# Patient Record
Sex: Male | Born: 1955 | Race: White | Hispanic: No | Marital: Married | Smoking: Former smoker
Health system: Southern US, Community
[De-identification: ages and names within clinical notes are randomized; demographics above are authoritative.]

## PROBLEM LIST (undated history)

## (undated) DIAGNOSIS — F411 Generalized anxiety disorder: Secondary | ICD-10-CM

## (undated) DIAGNOSIS — J309 Allergic rhinitis, unspecified: Secondary | ICD-10-CM

## (undated) DIAGNOSIS — I10 Essential (primary) hypertension: Secondary | ICD-10-CM

## (undated) DIAGNOSIS — K5790 Diverticulosis of intestine, part unspecified, without perforation or abscess without bleeding: Secondary | ICD-10-CM

## (undated) DIAGNOSIS — B001 Herpesviral vesicular dermatitis: Secondary | ICD-10-CM

## (undated) HISTORY — DX: Herpesviral vesicular dermatitis: B00.1

## (undated) HISTORY — DX: Generalized anxiety disorder: F41.1

## (undated) HISTORY — DX: Diverticulosis of intestine, part unspecified, without perforation or abscess without bleeding: K57.90

## (undated) HISTORY — DX: Allergic rhinitis, unspecified: J30.9

## (undated) HISTORY — DX: Essential (primary) hypertension: I10

---

## 2001-09-04 ENCOUNTER — Emergency Department (HOSPITAL_COMMUNITY): Admission: EM | Admit: 2001-09-04 | Discharge: 2001-09-04 | Payer: Self-pay | Admitting: *Deleted

## 2001-09-11 ENCOUNTER — Emergency Department (HOSPITAL_COMMUNITY): Admission: EM | Admit: 2001-09-11 | Discharge: 2001-09-11 | Payer: Self-pay | Admitting: Emergency Medicine

## 2005-08-25 ENCOUNTER — Emergency Department (HOSPITAL_COMMUNITY): Admission: EM | Admit: 2005-08-25 | Discharge: 2005-08-25 | Payer: Self-pay | Admitting: Family Medicine

## 2005-09-01 ENCOUNTER — Emergency Department (HOSPITAL_COMMUNITY): Admission: EM | Admit: 2005-09-01 | Discharge: 2005-09-01 | Payer: Self-pay | Admitting: Emergency Medicine

## 2007-10-13 DIAGNOSIS — K5790 Diverticulosis of intestine, part unspecified, without perforation or abscess without bleeding: Secondary | ICD-10-CM

## 2007-10-13 HISTORY — DX: Diverticulosis of intestine, part unspecified, without perforation or abscess without bleeding: K57.90

## 2007-10-13 LAB — HM COLONOSCOPY

## 2009-11-04 ENCOUNTER — Emergency Department (HOSPITAL_COMMUNITY): Admission: EM | Admit: 2009-11-04 | Discharge: 2009-11-04 | Payer: Self-pay | Admitting: Emergency Medicine

## 2010-09-30 DIAGNOSIS — I10 Essential (primary) hypertension: Secondary | ICD-10-CM

## 2010-09-30 HISTORY — DX: Essential (primary) hypertension: I10

## 2011-12-18 ENCOUNTER — Ambulatory Visit (INDEPENDENT_AMBULATORY_CARE_PROVIDER_SITE_OTHER): Payer: Managed Care, Other (non HMO) | Admitting: Family Medicine

## 2011-12-18 ENCOUNTER — Encounter: Payer: Self-pay | Admitting: Family Medicine

## 2011-12-18 VITALS — BP 150/100 | HR 76 | Ht 69.25 in | Wt 210.0 lb

## 2011-12-18 DIAGNOSIS — I10 Essential (primary) hypertension: Secondary | ICD-10-CM

## 2011-12-18 DIAGNOSIS — J309 Allergic rhinitis, unspecified: Secondary | ICD-10-CM | POA: Insufficient documentation

## 2011-12-18 MED ORDER — FLUTICASONE PROPIONATE 50 MCG/ACT NA SUSP: 2.0000 | Freq: Every day | NASAL | Status: DC

## 2011-12-18 NOTE — Patient Instructions (Addendum)
Please check BP's regularly at home, and keep a journal (date, am/pm/comments) Low sodium diet. Regular exercise, at least 30 minutes of aerobic activity most days of the week (5-6 days) Avoid using decongestants.  Change from Claritin D to either plain Claritin or plain Zyrtec.  Use sinus rinses or Neti-pot if needed for sinus pain.  Use the Flonase every day.  If your allergy symptoms are improving, you can consider cutting back to 1 spray daily in each nostril for maintenance, but it is safe to continue to use 2 sprays in each nostril everyday longterm if needed    2 Gram Low Sodium Diet A 2 gram sodium diet restricts the amount of sodium in the diet to no more than 2 g or 2000 mg daily. Limiting the amount of sodium is often used to help lower blood pressure. It is important if you have heart, liver, or kidney problems. Many foods contain sodium for flavor and sometimes as a preservative. When the amount of sodium in a diet needs to be low, it is important to know what to look for when choosing foods and drinks. The following includes some information and guidelines to help make it easier for you to adapt to a low sodium diet. QUICK TIPS  Do not add salt to food.   Avoid convenience items and fast food.   Choose unsalted snack foods.   Buy lower sodium products, often labeled as "lower sodium" or "no salt added."   Check food labels to learn how much sodium is in 1 serving.   When eating at a restaurant, ask that your food be prepared with less salt or none, if possible.  READING FOOD LABELS FOR SODIUM INFORMATION The nutrition facts label is a good place to find how much sodium is in foods. Look for products with no more than 500 to 600 mg of sodium per meal and no more than 150 mg per serving. Remember that 2 g = 2000 mg. The food label may also list foods as:  Sodium-free: Less than 5 mg in a serving.   Very low sodium: 35 mg or less in a serving.   Low-sodium: 140 mg or less in  a serving.   Light in sodium: 50% less sodium in a serving. For example, if a food that usually has 300 mg of sodium is changed to become light in sodium, it will have 150 mg of sodium.   Reduced sodium: 25% less sodium in a serving. For example, if a food that usually has 400 mg of sodium is changed to reduced sodium, it will have 300 mg of sodium.  CHOOSING FOODS Grains  Avoid: Salted crackers and snack items. Some cereals, including instant hot cereals. Bread stuffing and biscuit mixes. Seasoned rice or pasta mixes.   Choose: Unsalted snack items. Low-sodium cereals, oats, puffed wheat and rice, shredded wheat. English muffins and bread. Pasta.  Meats  Avoid: Salted, canned, smoked, spiced, pickled meats, including fish and poultry. Bacon, ham, sausage, cold cuts, hot dogs, anchovies.   Choose: Low-sodium canned tuna and salmon. Fresh or frozen meat, poultry, and fish.  Dairy  Avoid: Processed cheese and spreads. Cottage cheese. Buttermilk and condensed milk. Regular cheese.   Choose: Milk. Low-sodium cottage cheese. Yogurt. Sour cream. Low-sodium cheese.  Fruits and Vegetables  Avoid: Regular canned vegetables. Regular canned tomato sauce and paste. Frozen vegetables in sauces. Olives. Rosita Fire. Relishes. Sauerkraut.   Choose: Low-sodium canned vegetables. Low-sodium tomato sauce and paste. Frozen or fresh vegetables.  Fresh and frozen fruit.  Condiments  Avoid: Canned and packaged gravies. Worcestershire sauce. Tartar sauce. Barbecue sauce. Soy sauce. Steak sauce. Ketchup. Onion, garlic, and table salt. Meat flavorings and tenderizers.   Choose: Fresh and dried herbs and spices. Low-sodium varieties of mustard and ketchup. Lemon juice. Tabasco sauce. Horseradish.  SAMPLE 2 GRAM SODIUM MEAL PLAN Breakfast / Sodium (mg)  1 cup low-fat milk / 143 mg   2 slices whole-wheat toast / 270 mg   1 tbs heart-healthy margarine / 153 mg   1 hard-boiled egg / 139 mg   1 small orange /  0 mg  Lunch / Sodium (mg)  1 cup raw carrots / 76 mg    cup hummus / 298 mg   1 cup low-fat milk / 143 mg    cup red grapes / 2 mg   1 whole-wheat pita bread / 356 mg  Dinner / Sodium (mg)  1 cup whole-wheat pasta / 2 mg   1 cup low-sodium tomato sauce / 73 mg   3 oz lean ground beef / 57 mg   1 small side salad (1 cup raw spinach leaves,  cup cucumber,  cup yellow bell pepper) with 1 tsp olive oil and 1 tsp red wine vinegar / 25 mg  Snack / Sodium (mg)  1 container low-fat vanilla yogurt / 107 mg   3 graham cracker squares / 127 mg  Nutrient Analysis  Calories: 2033   Protein: 77 g   Carbohydrate: 282 g   Fat: 72 g   Sodium: 1971 mg  Document Released: 09/16/2005 Document Revised: 09/05/2011 Document Reviewed: 12/18/2009 [facility] Patient Information 2012 [place], [place].

## 2011-12-18 NOTE — Progress Notes (Signed)
Patient presents to establish care.    He was diagnosed with HTN last year.  Sometimes BP's run 147/92, other times is 135-138/86. Doesn't add much salt to foods, but eats processed foods.  Doesn't exercise daily, but a couple of times a week.  Walks the golf course some. He denies any cough, and reports being compliant with taking his lisinopril.  Allergies--runny nose, ear congestion, sinus headaches.  Currently taking Claritin D 12 hour with improvement in symptoms.  Previously recalls using nasal steroid sprays (nasacort), but didn't use it consistently.  Brings in labs from 05/2011----TG 107, HDL 50, LDL 113, total 184.  B-met normal  Past Medical History  Diagnosis Date  . Hypertension 2012  . Allergic rhinitis, cause unspecified     seasonal  . Herpes labialis     (prev treated by Dr. Yetta Barre with Valtrex 1 gm)  . Anxiety state, unspecified     resolved, related to death of nephew    History reviewed. No pertinent past surgical history.  History   Social History  . Marital Status: Married    Spouse Name: N/A    Number of Children: 2  . 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 Smoking status: Former Smoker    Quit date: 09/30/1998  . Smokeless tobacco: Never Used  . Alcohol Use: Yes     2-4 drinks per week.  . Drug Use: No  . Sexually Active: Not on file   Other Topics Concern  . Not on file   Social History Narrative   Married.  Son is an air [employer] MD in [place].  Expecting first granddaughter.  Daughter lives in [place]. 1 cat, 1 dog    Family History  Problem Relation Age of Onset  . Stroke Mother   . Hypertension Mother   . Parkinsonism Mother   . Parkinsonism Father   . Heart disease Father 36    MI  . COPD Maternal Grandfather   . Diabetes Neg Hx   . Cancer Neg Hx     Current outpatient prescriptions:lisinopril (PRINIVIL,ZESTRIL) 10 MG tablet, Take 10 mg by mouth  daily., Disp: , Rfl: ;  loratadine-pseudoephedrine (CLARITIN-D 12-HOUR) 5-120 MG per tablet, Take 1 tablet by mouth 2 (two) times daily., Disp: , Rfl: ;  ALPRAZolam (XANAX) 1 MG tablet, , Disp: , Rfl: ;  fluticasone (FLONASE) 50 MCG/ACT nasal spray, Place 2 sprays into the nose daily., Disp: 16 g, Rfl: 6 Valtrex prn (1gm)  No Known Allergies  ROS:  Denies fevers. Occasional headaches.  Denies dizziness, chest pain. Occasional palpitations.  Occasional heartburn after certain foods, relieved by Tums.  Denies abdominal pain, blood in stool, urinary problems, skin rashes, or other concerns. Took xanax related to his nephew's death.  Anxiety has been better since his dad passed away. +Cold sores--uses Valtrex prn  PHYSICAL EXAM: BP 150/100  Pulse 76  Ht 5' 9.25" (1.759 m)  Wt 210 lb (95.255 kg)  BMI 30.79 kg/m2 Well developed, pleasant, overweight male in no distress HEENT:  PERRL, EOMI, conjunctiva clear.  TM's and EAC's normal.  Nasal mucosa moderately edematous, pale, no purulence.  Sinuses nontender.  Op clear Neck: no lymphadenopathy or thyromegaly, no carotid bruit Heart: regular rate and rhythm without murmur Lungs: clear bilaterally Abdomen: soft, obese, nontender, no organomegaly or mass Extremities: no edema, 2+ pulse Skin: no rash Psych: normal mood, affect, hygiene and grooming  ASSESSMENT/PLAN:  1. Allergic rhinitis, cause unspecified  fluticasone (FLONASE) 50 MCG/ACT nasal spray  2. Essential hypertension, benign     bp suboptimally controlled, but decongestants may be contributing.  Reviewed low sodium diet, daily exercise, avoid decongestants. Monitor BP regularly.  Continue current dose of lisinopril.   F/u in 1 month with list of BP's.  Bring monitor to f/u visit to have accuracy checked.  If BP's still >135/85, plan to increase lisinopril dose.  Allergies--stop Claritin D.  May use plain Claritin or Zyrtec.  Start Flonase--instructed on proper use.

## 2011-12-30 ENCOUNTER — Encounter: Payer: Self-pay | Admitting: Family Medicine

## 2012-01-01 ENCOUNTER — Encounter: Payer: Self-pay | Admitting: *Deleted

## 2012-01-27 ENCOUNTER — Encounter: Payer: Self-pay | Admitting: Family Medicine

## 2012-01-27 ENCOUNTER — Ambulatory Visit (INDEPENDENT_AMBULATORY_CARE_PROVIDER_SITE_OTHER): Payer: Managed Care, Other (non HMO) | Admitting: Family Medicine

## 2012-01-27 VITALS — BP 132/84 | HR 76 | Ht 69.25 in | Wt 209.0 lb

## 2012-01-27 DIAGNOSIS — I10 Essential (primary) hypertension: Secondary | ICD-10-CM

## 2012-01-27 DIAGNOSIS — J309 Allergic rhinitis, unspecified: Secondary | ICD-10-CM

## 2012-01-27 MED ORDER — LISINOPRIL 10 MG PO TABS: 10.0000 mg | ORAL_TABLET | Freq: Every day | ORAL | Status: DC

## 2012-01-27 NOTE — Progress Notes (Signed)
Patient presents for follow-up on his blood pressure.  At his last visit, his allergies were flaring, and had been using decongestants, and BP was elevated.  Allergies are doing much better since on the Flonase, and using claritin (plain) just as needed.  BP's at home 121/78 - 142/85, mostly 130's/80.  He ran out of BP medication yesterday--hasn't taken it today yet.  Denies headaches, cough.  Getting exercise daily, and has lot 6 pounds per scale at home. Old records reviewed--last lipids 05/2011 total 184, LDL 113, HDL 50  Past Medical History  Diagnosis Date  . Hypertension 2012  . Allergic rhinitis, cause unspecified     seasonal  . Herpes labialis     (prev treated by Dr. Yetta Barre with Valtrex 1 gm)  . Anxiety state, unspecified     resolved, related to death of nephew  . Diverticulosis 10/13/07    on colonoscopy (Dr. Elnoria Howard)  . Hemorrhoids 10/13/07   Current Outpatient Prescriptions on File Prior to Visit  Medication Sig Dispense Refill  . fluticasone (FLONASE) 50 MCG/ACT nasal spray Place 2 sprays into the nose daily.  16 g  6  . DISCONTD: lisinopril (PRINIVIL,ZESTRIL) 10 MG tablet Take 10 mg by mouth daily.      Marland Kitchen ALPRAZolam (XANAX) 1 MG tablet        No Known Allergies  ROS:  Denies fevers, URI symptoms, chest pain, headaches, dizziness, edema, shortness of breath or other concerns  PHYSICAL EXAM: BP 132/84  Pulse 76  Ht 5' 9.25" (1.759 m)  Wt 209 lb (94.802 kg)  BMI 30.64 kg/m2  Pt's machine L arm 153/93 R arm by MD 154/88  Neck: no lymphadenopathy Heart: regular rate and rhythm without murmur Lungs: clear bilaterally Extremities: no edema Psych: normal mood, affect  ASSESSMENT/PLAN: 1. Essential hypertension, benign  lisinopril (PRINIVIL,ZESTRIL) 10 MG tablet  2. Allergic rhinitis, cause unspecified     Allergies improved with flonase. HTN--borderline control, some white coat component, but his machine is accurate.  Continue lifestyle modifications and current  dose of lisinopril.  If remains borderline at his CPE, consider increasing dose.

## 2012-01-27 NOTE — Patient Instructions (Signed)
Continue low sodium diet, daily exercise and weight loss. Continue to monitor your blood pressure 1-2x/week  Bring list to your next appointment. Send list or call sooner, if BP's are running consistently >140/90

## 2012-02-21 ENCOUNTER — Encounter: Payer: Self-pay | Admitting: Family Medicine

## 2012-02-21 ENCOUNTER — Ambulatory Visit (INDEPENDENT_AMBULATORY_CARE_PROVIDER_SITE_OTHER): Payer: Managed Care, Other (non HMO) | Admitting: Family Medicine

## 2012-02-21 VITALS — BP 120/78 | HR 76 | Temp 97.9°F | Ht 70.5 in | Wt 209.0 lb

## 2012-02-21 DIAGNOSIS — L089 Local infection of the skin and subcutaneous tissue, unspecified: Secondary | ICD-10-CM

## 2012-02-21 DIAGNOSIS — L723 Sebaceous cyst: Secondary | ICD-10-CM

## 2012-02-21 MED ORDER — CEPHALEXIN 500 MG PO CAPS: 500.0000 mg | ORAL_CAPSULE | Freq: Three times a day (TID) | ORAL | Status: AC

## 2012-02-21 NOTE — Patient Instructions (Signed)
Sebaceous cyst with early infection.  Not ready to be drained. Warm compresses and course of Keflex (antibiotic).  Return next week if increasing size, pain, fever  Epidermal Cyst An epidermal cyst is sometimes called a sebaceous cyst, epidermal inclusion cyst, or infundibular cyst. These cysts usually contain a substance that looks "pasty" or "cheesy" and may have a bad smell. This substance is a protein called keratin. Epidermal cysts are usually found on the face, neck, or trunk. They may also occur in the vaginal area or other parts of the genitalia of both men and women. Epidermal cysts are usually small, painless, slow-growing bumps or lumps that move freely under the skin. It is important not to try to pop them. This may cause an infection and lead to tenderness and swelling. CAUSES  Epidermal cysts may be caused by a deep penetrating injury to the skin or a plugged hair follicle, often associated with acne. SYMPTOMS  Epidermal cysts can become inflamed and cause:  Redness.   Tenderness.   Increased temperature of the skin over the bumps or lumps.   Grayish-white, bad smelling material that drains from the bump or lump.  DIAGNOSIS  Epidermal cysts are easily diagnosed by your caregiver during an exam. Rarely, a tissue sample (biopsy) may be taken to rule out other conditions that may resemble epidermal cysts. TREATMENT   Epidermal cysts often get better and disappear on their own. They are rarely ever cancerous.   If a cyst becomes infected, it may become inflamed and tender. This may require opening and draining the cyst. Treatment with antibiotics may be necessary. When the infection is gone, the cyst may be removed with minor surgery.   Small, inflamed cysts can often be treated with antibiotics or by injecting steroid medicines.   Sometimes, epidermal cysts become large and bothersome. If this happens, surgical removal in your caregiver's office may be necessary.  HOME CARE  INSTRUCTIONS  Only take over-the-counter or prescription medicines as directed by your caregiver.   Take your antibiotics as directed. Finish them even if you start to feel better.  SEEK MEDICAL CARE IF:   Your cyst becomes tender, red, or swollen.   Your condition is not improving or is getting worse.   You have any other questions or concerns.  MAKE SURE YOU:  Understand these instructions.   Will watch your condition.   Will get help right away if you are not doing well or get worse.  Document Released: 08/17/2004 Document Revised: 09/05/2011 Document Reviewed: 03/25/2011 [facility] Patient Information 2012 [place], [place].

## 2012-02-21 NOTE — Progress Notes (Signed)
Chief Complaint  Patient presents with  . bump on neck    bump on the back of neck. noticed it last 4-5 days. alittle pain and hard.    HPI:  Has had similar problems in the last 10 years.  Has been treated with creams and antibiotics in the past. Denies fevers or drainage, but lump is increasing in size, and is a little sore.  Past Medical History  Diagnosis Date  . Hypertension 2012  . Allergic rhinitis, cause unspecified     seasonal  . Herpes labialis     (prev treated by Dr. Yetta Barre with Valtrex 1 gm)  . Anxiety state, unspecified     resolved, related to death of nephew  . Diverticulosis 10/13/07    on colonoscopy (Dr. Elnoria Howard)  . Hemorrhoids 10/13/07   No past surgical history on file. Current Outpatient Prescriptions on File Prior to Visit  Medication Sig Dispense Refill  . fluticasone (FLONASE) 50 MCG/ACT nasal spray Place 2 sprays into the nose daily.  16 g  6  . lisinopril (PRINIVIL,ZESTRIL) 10 MG tablet Take 1 tablet (10 mg total) by mouth daily.  90 tablet  1  . ALPRAZolam (XANAX) 1 MG tablet        No Known Allergies  ROS: BP's have been good, diastolic usually <57.  Denies headaches, fevers, nausea, vomiting, other skin complaints or rashes.  PHYSICAL EXAM: BP 120/78  Pulse 76  Ht 5' 10.5" (1.791 m)  Wt 209 lb (94.802 kg)  BMI 29.56 kg/m2  L posterior neck--2.5 x 1.5 cm subcutaneous mass with black pore noted at 10 o'clock position.  No fluctuance or drainage.  Mild erythema, no warmth. Neck: no lymphadenopathy  ASSESSMENT/PLAN: 1. Infected sebaceous cyst of skin  cephALEXin (KEFLEX) 500 MG capsule   Sebaceous cyst with early infection.  Not ready to be I&D's.  Warm compresses and course of Keflex.  Return next week if increasing size, pain, fever

## 2012-06-03 ENCOUNTER — Encounter: Payer: Self-pay | Admitting: Family Medicine

## 2012-06-03 ENCOUNTER — Ambulatory Visit (INDEPENDENT_AMBULATORY_CARE_PROVIDER_SITE_OTHER): Payer: Managed Care, Other (non HMO) | Admitting: Family Medicine

## 2012-06-03 VITALS — BP 142/90 | HR 72 | Ht 70.0 in | Wt 210.0 lb

## 2012-06-03 DIAGNOSIS — J309 Allergic rhinitis, unspecified: Secondary | ICD-10-CM

## 2012-06-03 DIAGNOSIS — Z0000 Encounter for general adult medical examination without abnormal findings: Secondary | ICD-10-CM

## 2012-06-03 DIAGNOSIS — I10 Essential (primary) hypertension: Secondary | ICD-10-CM

## 2012-06-03 DIAGNOSIS — Z125 Encounter for screening for malignant neoplasm of prostate: Secondary | ICD-10-CM

## 2012-06-03 LAB — POCT URINALYSIS DIPSTICK
Bilirubin, UA: NEGATIVE
Blood, UA: NEGATIVE
Glucose, UA: NEGATIVE
Ketones, UA: NEGATIVE
Leukocytes, UA: NEGATIVE
Nitrite, UA: NEGATIVE
Protein, UA: NEGATIVE
Spec Grav, UA: <=1.005
Urobilinogen, UA: NEGATIVE
pH, UA: 7

## 2012-06-03 LAB — CBC WITH DIFFERENTIAL/PLATELET
Basophils Absolute: 0 10*3/uL (ref 0.0–0.1)
Basophils Relative: 1 % (ref 0–1)
Eosinophils Absolute: 0.2 10*3/uL (ref 0.0–0.7)
Eosinophils Relative: 2 % (ref 0–5)
HCT: 46.5 % (ref 39.0–52.0)
Hemoglobin: 16.2 g/dL (ref 13.0–17.0)
Lymphocytes Relative: 33 % (ref 12–46)
Lymphs Abs: 2.1 10*3/uL (ref 0.7–4.0)
MCH: 30.1 pg (ref 26.0–34.0)
MCHC: 34.8 g/dL (ref 30.0–36.0)
MCV: 86.4 fL (ref 78.0–100.0)
Monocytes Absolute: 0.4 10*3/uL (ref 0.1–1.0)
Monocytes Relative: 7 % (ref 3–12)
Neutro Abs: 3.8 10*3/uL (ref 1.7–7.7)
Neutrophils Relative %: 57 % (ref 43–77)
Platelets: 257 10*3/uL (ref 150–400)
RBC: 5.38 MIL/uL (ref 4.22–5.81)
RDW: 13.1 % (ref 11.5–15.5)
WBC: 6.5 10*3/uL (ref 4.0–10.5)

## 2012-06-03 LAB — COMPREHENSIVE METABOLIC PANEL
ALT: 20 U/L (ref 0–53)
CO2: 25 mEq/L (ref 19–32)
Calcium: 9.9 mg/dL (ref 8.4–10.5)
Chloride: 104 mEq/L (ref 96–112)
Creat: 0.93 mg/dL (ref 0.50–1.35)
Glucose, Bld: 94 mg/dL (ref 70–99)

## 2012-06-03 LAB — LIPID PANEL
Cholesterol: 166 mg/dL (ref 0–200)
Total CHOL/HDL Ratio: 3.5 Ratio
Triglycerides: 129 mg/dL (ref <150)

## 2012-06-03 MED ORDER — LISINOPRIL 10 MG PO TABS
10.0000 mg | ORAL_TABLET | Freq: Every day | ORAL | Status: DC
Start: 2012-06-03 — End: 2012-11-30

## 2012-06-03 NOTE — Progress Notes (Signed)
Tyler Martin is a 56 y.o. male who presents for a complete physical.  He has the following concerns:  HTN follow-up.  BP's at home are running 116-145/78-91, mostly running  Mid 130's/mid 80's.    Immunization History  Administered Date(s) Administered  . Tdap 08/30/2010  gets flu shots yearly Last colonoscopy: 10/2007 Last PSA: 5 years ago Dentist: twice yearly Ophtho:  yearly Exercise:  3-6 days/week--weights, walking  Past Medical History  Diagnosis Date  . Hypertension 2012  . Allergic rhinitis, cause unspecified     seasonal  . Herpes labialis     (prev treated by Dr. Yetta Barre with Valtrex 1 gm)  . Anxiety state, unspecified     resolved, related to death of nephew  . Diverticulosis 10/13/07    on colonoscopy (Dr. Elnoria Howard)  . Hemorrhoids 10/13/07    History reviewed. No pertinent past surgical history.  History   Social History  . Marital Status: Married    Spouse Name: N/A    Number of Children: 2  . 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 Smoking status: Former Smoker -- 1.0 packs/day for 30 years    Types: Cigarettes    Quit date: 09/30/1998  . Smokeless tobacco: Never Used  . Alcohol Use: Yes     2-4 drinks per week.  . Drug Use: No  . Sexually Active: Yes -- Male partner(s)   Other Topics Concern  . Not on file   Social History Narrative   Married.  Son is an [employer] MD in [place] [employer]).  Daughter lives in [place]. 1 cat. 1 granddaughter.    Family History  Problem Relation Age of Onset  . Stroke Mother   . Hypertension Mother   . Parkinsonism Mother   . Parkinsonism Father   . Heart disease Father 24    MI  . COPD Maternal Grandfather   . Diabetes Neg Hx   . Cancer Maternal Uncle     prostate    Current outpatient prescriptions:fluticasone (FLONASE) 50 MCG/ACT nasal spray, Place 2 sprays into the nose daily., Disp: 16 g, Rfl: 6;   lisinopril (PRINIVIL,ZESTRIL) 10 MG tablet, Take 1 tablet (10 mg total) by mouth daily., Disp: 90 tablet, Rfl: 1;  ALPRAZolam (XANAX) 1 MG tablet, Take 1 mg by mouth as needed. , Disp: , Rfl:   No Known Allergies   ROS:  The patient denies anorexia, fever, weight changes, headaches,  vision loss, decreased hearing, ear pain, hoarseness, chest pain, palpitations, dizziness, syncope, dyspnea on exertion, cough, swelling, nausea, vomiting, diarrhea, constipation, abdominal pain, melena, hematochezia, indigestion/heartburn, hematuria, incontinence, erectile dysfunction, nocturia, weakened urine stream, dysuria, genital lesions, joint pains, numbness, tingling, weakness, tremor, suspicious skin lesions, depression, anxiety, abnormal bleeding/bruising, or enlarged lymph nodes  PHYSICAL EXAM: BP 150/100  Pulse 72  Ht 5\' 10"  (1.778 m)  Wt 210 lb (95.255 kg)  BMI 30.13 kg/m2 142 90  General Appearance:    Alert, cooperative, no distress, appears stated age  Head:    Normocephalic, without obvious abnormality, atraumatic  Eyes:    PERRL, conjunctiva/corneas clear, EOM's intact, fundi    benign  Ears:    Normal TM's and external ear canals  Nose:   Nares normal, mucosa normal, no drainage or sinus   tenderness  Throat:   Lips, mucosa, and tongue normal; teeth and gums normal  Neck:   Supple, no lymphadenopathy;  thyroid:  no   enlargement/tenderness/nodules; no carotid   bruit or JVD  Back:    Spine nontender, no curvature, ROM normal, no CVA     tenderness  Lungs:     Clear to auscultation bilaterally without wheezes, rales or     ronchi; respirations unlabored  Chest Wall:    No tenderness or deformity   Heart:    Regular rate and rhythm, S1 and S2 normal, no murmur, rub   or gallop  Breast Exam:    No chest wall tenderness, masses or gynecomastia  Abdomen:     Soft, non-tender, obese, nondistended, normoactive bowel sounds,    no masses, no hepatosplenomegaly  Genitalia:    Normal male  external genitalia without lesions.  Testicles without masses.  No inguinal hernias.  Rectal:    Normal sphincter tone, no masses or tenderness; guaiac negative stool.  Prostate smooth, no nodules, not enlarged.  Extremities:   No clubbing, cyanosis or edema  Pulses:   2+ and symmetric all extremities  Skin:   Skin color, texture, turgor normal, no rashes or lesions  Lymph nodes:   Cervical, supraclavicular, and axillary nodes normal  Neurologic:   CNII-XII intact, normal strength, sensation and gait; reflexes 2+ and symmetric throughout          Psych:   Normal mood, affect, hygiene and grooming.     ASSESSMENT/PLAN: 1. Routine general medical examination at a health care facility  POCT Urinalysis Dipstick, Visual acuity screening, CBC with Differential  2. Allergic rhinitis, cause unspecified    3. Essential hypertension, benign  CBC with Differential, Comprehensive metabolic panel, Lipid panel, lisinopril (PRINIVIL,ZESTRIL) 10 MG tablet  4. Special screening for malignant neoplasm of prostate  PSA    HTN--borderline control. Discussed increasing lisinopril vs weight loss and low sodium diet.  Prefers latter.   Reviewed portion control, healthy diet, increased exercise to help with weight loss.  Discussed PSA screening (risks/benefits), recommended at least 30 minutes of aerobic activity at least 5 days/week; proper sunscreen use reviewed; healthy diet and alcohol recommendations (less than or equal to 2 drinks/day) reviewed; regular seatbelt use; changing batteries in smoke detectors. Self-testicular exams. Immunization recommendations discussed--annual flu shots, shingles vaccine (if covered).  Colonoscopy recommendations reviewed--UTD  Shingles vaccine discussed--will check insurance, and if not covered until 60, will wait.   F/u 6 months on BP

## 2012-06-03 NOTE — Patient Instructions (Addendum)
HEALTH MAINTENANCE RECOMMENDATIONS:  It is recommended that you get at least 30 minutes of aerobic exercise at least 5 days/week (for weight loss, you may need as much as 60-90 minutes). This can be any activity that gets your heart rate up. This can be divided in 10-15 minute intervals if needed, but try and build up your endurance at least once a week.  Weight bearing exercise is also recommended twice weekly.  Eat a healthy diet with lots of vegetables, fruits and fiber.  "Colorful" foods have a lot of vitamins (ie green vegetables, tomatoes, red peppers, etc).  Limit sweet tea, regular sodas and alcoholic beverages, all of which has a lot of calories and sugar.  Up to 2 alcoholic drinks daily may be beneficial for men (unless trying to lose weight, watch sugars).  Drink a lot of water.  Sunscreen of at least SPF 30 should be used on all sun-exposed parts of the skin when outside between the hours of 10 am and 4 pm (not just when at beach or pool, but even with exercise, golf, tennis, and yard work!)  Use a sunscreen that says "broad spectrum" so it covers both UVA and UVB rays, and make sure to reapply every 1-2 hours.  Remember to change the batteries in your smoke detectors when changing your clock times in the spring and fall.  Use your seat belt every time you are in a car, and please drive safely and not be distracted with cell phones and texting while driving.  2 Gram Low Sodium Diet A 2 gram sodium diet restricts the amount of sodium in the diet to no more than 2 g or 2000 mg daily. Limiting the amount of sodium is often used to help lower blood pressure. It is important if you have heart, liver, or kidney problems. Many foods contain sodium for flavor and sometimes as a preservative. When the amount of sodium in a diet needs to be low, it is important to know what to look for when choosing foods and drinks. The following includes some information and guidelines to help make it easier for you  to adapt to a low sodium diet. QUICK TIPS  Do not add salt to food.   Avoid convenience items and fast food.   Choose unsalted snack foods.   Buy lower sodium products, often labeled as "lower sodium" or "no salt added."   Check food labels to learn how much sodium is in 1 serving.   When eating at a restaurant, ask that your food be prepared with less salt or none, if possible.  READING FOOD LABELS FOR SODIUM INFORMATION The nutrition facts label is a good place to find how much sodium is in foods. Look for products with no more than 500 to 600 mg of sodium per meal and no more than 150 mg per serving. Remember that 2 g = 2000 mg. The food label may also list foods as:  Sodium-free: Less than 5 mg in a serving.   Very low sodium: 35 mg or less in a serving.   Low-sodium: 140 mg or less in a serving.   Light in sodium: 50% less sodium in a serving. For example, if a food that usually has 300 mg of sodium is changed to become light in sodium, it will have 150 mg of sodium.   Reduced sodium: 25% less sodium in a serving. For example, if a food that usually has 400 mg of sodium is changed to reduced sodium,  it will have 300 mg of sodium.  CHOOSING FOODS Grains  Avoid: Salted crackers and snack items. Some cereals, including instant hot cereals. Bread stuffing and biscuit mixes. Seasoned rice or pasta mixes.   Choose: Unsalted snack items. Low-sodium cereals, oats, puffed wheat and rice, shredded wheat. English muffins and bread. Pasta.  Meats  Avoid: Salted, canned, smoked, spiced, pickled meats, including fish and poultry. Bacon, ham, sausage, cold cuts, hot dogs, anchovies.   Choose: Low-sodium canned tuna and salmon. Fresh or frozen meat, poultry, and fish.  Dairy  Avoid: Processed cheese and spreads. Cottage cheese. Buttermilk and condensed milk. Regular cheese.   Choose: Milk. Low-sodium cottage cheese. Yogurt. Sour cream. Low-sodium cheese.  Fruits and  Vegetables  Avoid: Regular canned vegetables. Regular canned tomato sauce and paste. Frozen vegetables in sauces. Olives. Rosita Fire. Relishes. Sauerkraut.   Choose: Low-sodium canned vegetables. Low-sodium tomato sauce and paste. Frozen or fresh vegetables. Fresh and frozen fruit.  Condiments  Avoid: Canned and packaged gravies. Worcestershire sauce. Tartar sauce. Barbecue sauce. Soy sauce. Steak sauce. Ketchup. Onion, garlic, and table salt. Meat flavorings and tenderizers.   Choose: Fresh and dried herbs and spices. Low-sodium varieties of mustard and ketchup. Lemon juice. Tabasco sauce. Horseradish.  SAMPLE 2 GRAM SODIUM MEAL PLAN Breakfast / Sodium (mg)  1 cup low-fat milk / 143 mg   2 slices whole-wheat toast / 270 mg   1 tbs heart-healthy margarine / 153 mg   1 hard-boiled egg / 139 mg   1 small orange / 0 mg  Lunch / Sodium (mg)  1 cup raw carrots / 76 mg    cup hummus / 298 mg   1 cup low-fat milk / 143 mg    cup red grapes / 2 mg   1 whole-wheat pita bread / 356 mg  Dinner / Sodium (mg)  1 cup whole-wheat pasta / 2 mg   1 cup low-sodium tomato sauce / 73 mg   3 oz lean ground beef / 57 mg   1 small side salad (1 cup raw spinach leaves,  cup cucumber,  cup yellow bell pepper) with 1 tsp olive oil and 1 tsp red wine vinegar / 25 mg  Snack / Sodium (mg)  1 container low-fat vanilla yogurt / 107 mg   3 graham cracker squares / 127 mg  Nutrient Analysis  Calories: 2033   Protein: 77 g   Carbohydrate: 282 g   Fat: 72 g   Sodium: 1971 mg  Document Released: 09/16/2005 Document Revised: 09/05/2011 Document Reviewed: 12/18/2009 [facility] Patient Information 2012 [place], [place].

## 2012-06-04 ENCOUNTER — Encounter: Payer: Self-pay | Admitting: Family Medicine

## 2012-06-11 ENCOUNTER — Other Ambulatory Visit (INDEPENDENT_AMBULATORY_CARE_PROVIDER_SITE_OTHER): Payer: Managed Care, Other (non HMO)

## 2012-06-11 DIAGNOSIS — Z23 Encounter for immunization: Secondary | ICD-10-CM

## 2012-07-15 ENCOUNTER — Other Ambulatory Visit: Payer: Self-pay | Admitting: Family Medicine

## 2012-07-15 MED ORDER — VALACYCLOVIR HCL 1 G PO TABS: ORAL_TABLET | ORAL | Status: DC

## 2012-07-15 NOTE — Telephone Encounter (Signed)
Please confirm that he is needing meds for prn use (when he has outbreak, rather than preventative). Meds should be used as soon as possibly (may not have much benefit if lesion present >48 hrs).

## 2012-07-23 ENCOUNTER — Other Ambulatory Visit: Payer: Self-pay | Admitting: Family Medicine

## 2012-10-26 ENCOUNTER — Ambulatory Visit (INDEPENDENT_AMBULATORY_CARE_PROVIDER_SITE_OTHER): Payer: Managed Care, Other (non HMO) | Admitting: Family Medicine

## 2012-10-26 ENCOUNTER — Encounter: Payer: Self-pay | Admitting: Family Medicine

## 2012-10-26 VITALS — BP 132/84 | HR 84 | Temp 98.6°F | Ht 70.0 in | Wt 208.0 lb

## 2012-10-26 DIAGNOSIS — I10 Essential (primary) hypertension: Secondary | ICD-10-CM

## 2012-10-26 DIAGNOSIS — J069 Acute upper respiratory infection, unspecified: Secondary | ICD-10-CM

## 2012-10-26 MED ORDER — AMOXICILLIN 500 MG PO CAPS: 1000.0000 mg | ORAL_CAPSULE | Freq: Two times a day (BID) | ORAL | Status: DC

## 2012-10-26 NOTE — Patient Instructions (Signed)
Add mucinex.  Continue other current medications.  Monitor BP periodically while taking decongestants. Start antibiotics if symptoms persist/worsen (discolored mucus last more than 7-10 days, fevers start, worsening sinus pain, etc)

## 2012-10-26 NOTE — Progress Notes (Signed)
Chief Complaint  Patient presents with  . URI    congestion that started last Thursday while in [place]. Sunday morning woke up with dry throat. Today sinuses hurt and left ear pain.   HPI:  Started with runny nose about 4 days ago.  Sometimes mucus is clear, other times bloody or green.  Yesterday noticed increase drainage and sore throat, and today is complaining of left ear pain, along with sinus pressure across forehead.    Using Vick's, Nyquil, Dayquil, and started out with Claritin D while he was away in [place].  Meds help dry of some of the drainage.  Just starting to get a hacky cough.  Past Medical History  Diagnosis Date  . Hypertension 2012  . Allergic rhinitis, cause unspecified     seasonal  . Herpes labialis     (prev treated by Dr. Yetta Barre with Valtrex 1 gm)  . Anxiety state, unspecified     resolved, related to death of nephew  . Diverticulosis 10/13/07    on colonoscopy (Dr. Elnoria Howard)  . Hemorrhoids 10/13/07   History reviewed. No pertinent past surgical history. History   Social History  . Marital Status: Married    Spouse Name: N/A    Number of Children: 2  . 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 Smoking status: Former Smoker -- 1.0 packs/day for 30 years    Types: Cigarettes    Quit date: 09/30/1998  . Smokeless tobacco: Never Used  . Alcohol Use: Yes     Comment: 2-4 drinks per week.  . Drug Use: No  . Sexually Active: Yes -- Male partner(s)   Other Topics Concern  . Not on file   Social History Narrative   Married.  Son is an air [employer] MD in [place] [employer]).  Daughter lives in [place]. 1 cat. 1 granddaughter.   Current Outpatient Prescriptions on File Prior to Visit  Medication Sig Dispense Refill  . fluticasone (FLONASE) 50 MCG/ACT nasal spray PLACE 2 SPRAYS INTO THE NOSE DAILY.  16 g  6  . lisinopril (PRINIVIL,ZESTRIL) 10 MG tablet Take 1 tablet (10 mg  total) by mouth daily.  30 tablet  5  . ALPRAZolam (XANAX) 1 MG tablet Take 1 mg by mouth as needed.       . valACYclovir (VALTREX) 1000 MG tablet Take 2 tablets at onset of cold sore.  Repeat once in 12 hours (total of 4 tablets per course)  30 tablet  0   No Known Allergies  ROS:  Denies fevers, nausea, vomiting, diarrhea, skin rashes, bleeding/bruising, myalgias or joint pains.  +sinus headache.  PHYSICAL EXAM: BP 132/84  Pulse 84  Temp 98.6 F (37 C) (Oral)  Ht 5\' 10"   (1.778 m)  Wt 208 lb (94.348 kg)  BMI 29.84 kg/m2 Pleasant male, well appearing, in no distress HEENT: PERRL, EOMI, conjunctiva clear.  Nasal mucosa moderately edematous, clear mucus, some yellow crusting noted.  Sinuses nontender.  OP clear Neck: no lymphadenopathy or mass Heart: regular rate and rhythm without murmur Lungs: clear bilaterally Extremities: no edema Skin: no rash Psych: normal mood, affect, hygiene and grooming  ASSESSMENT/PLAN:  1. URI (upper respiratory infection)  amoxicillin (AMOXIL) 500 MG capsule  2. Essential hypertension, benign     URI Add mucinex.  Continue other current medications.  Monitor BP periodically while taking decongestants. Start antibiotics if symptoms persist/worsen (discolored mucus last more than  7-10 days, fevers start, worsening sinus pain, etc)  Ensure not getting duplicate medications in various OTC combinations  F/u prn

## 2012-11-30 ENCOUNTER — Encounter: Payer: Self-pay | Admitting: Family Medicine

## 2012-11-30 ENCOUNTER — Ambulatory Visit (INDEPENDENT_AMBULATORY_CARE_PROVIDER_SITE_OTHER): Payer: Managed Care, Other (non HMO) | Admitting: Family Medicine

## 2012-11-30 VITALS — BP 152/98 | HR 64 | Ht 70.0 in | Wt 207.0 lb

## 2012-11-30 DIAGNOSIS — I10 Essential (primary) hypertension: Secondary | ICD-10-CM

## 2012-11-30 DIAGNOSIS — Z79899 Other long term (current) drug therapy: Secondary | ICD-10-CM

## 2012-11-30 MED ORDER — LISINOPRIL 20 MG PO TABS: 20.0000 mg | ORAL_TABLET | Freq: Every day | ORAL | Status: DC

## 2012-11-30 NOTE — Patient Instructions (Signed)
Continue to check your blood pressure at home.  Follow low sodium diet.   Return in 3-4 weeks for a lab visit.  This can be nonfasting.  Bring your list of blood pressures with you, to leave for my review. If blood pressures are running <140/90 (ideally <135/85), then you have refills for 6 months.  2 Gram Low Sodium Diet A 2 gram sodium diet restricts the amount of sodium in the diet to no more than 2 g or 2000 mg daily. Limiting the amount of sodium is often used to help lower blood pressure. It is important if you have heart, liver, or kidney problems. Many foods contain sodium for flavor and sometimes as a preservative. When the amount of sodium in a diet needs to be low, it is important to know what to look for when choosing foods and drinks. The following includes some information and guidelines to help make it easier for you to adapt to a low sodium diet. QUICK TIPS  Do not add salt to food.  Avoid convenience items and fast food.  Choose unsalted snack foods.  Buy lower sodium products, often labeled as "lower sodium" or "no salt added."  Check food labels to learn how much sodium is in 1 serving.  When eating at a restaurant, ask that your food be prepared with less salt or none, if possible. READING FOOD LABELS FOR SODIUM INFORMATION The nutrition facts label is a good place to find how much sodium is in foods. Look for products with no more than 500 to 600 mg of sodium per meal and no more than 150 mg per serving. Remember that 2 g = 2000 mg. The food label may also list foods as:  Sodium-free: Less than 5 mg in a serving.  Very low sodium: 35 mg or less in a serving.  Low-sodium: 140 mg or less in a serving.  Light in sodium: 50% less sodium in a serving. For example, if a food that usually has 300 mg of sodium is changed to become light in sodium, it will have 150 mg of sodium.  Reduced sodium: 25% less sodium in a serving. For example, if a food that usually has 400 mg of  sodium is changed to reduced sodium, it will have 300 mg of sodium. CHOOSING FOODS Grains  Avoid: Salted crackers and snack items. Some cereals, including instant hot cereals. Bread stuffing and biscuit mixes. Seasoned rice or pasta mixes.  Choose: Unsalted snack items. Low-sodium cereals, oats, puffed wheat and rice, shredded wheat. English muffins and bread. Pasta. Meats  Avoid: Salted, canned, smoked, spiced, pickled meats, including fish and poultry. Bacon, ham, sausage, cold cuts, hot dogs, anchovies.  Choose: Low-sodium canned tuna and salmon. Fresh or frozen meat, poultry, and fish. Dairy  Avoid: Processed cheese and spreads. Cottage cheese. Buttermilk and condensed milk. Regular cheese.  Choose: Milk. Low-sodium cottage cheese. Yogurt. Sour cream. Low-sodium cheese. Fruits and Vegetables  Avoid: Regular canned vegetables. Regular canned tomato sauce and paste. Frozen vegetables in sauces. Olives. Rosita Fire. Relishes. Sauerkraut.  Choose: Low-sodium canned vegetables. Low-sodium tomato sauce and paste. Frozen or fresh vegetables. Fresh and frozen fruit. Condiments  Avoid: Canned and packaged gravies. Worcestershire sauce. Tartar sauce. Barbecue sauce. Soy sauce. Steak sauce. Ketchup. Onion, garlic, and table salt. Meat flavorings and tenderizers.  Choose: Fresh and dried herbs and spices. Low-sodium varieties of mustard and ketchup. Lemon juice. Tabasco sauce. Horseradish. SAMPLE 2 GRAM SODIUM MEAL PLAN Breakfast / Sodium (mg)  1 cup low-fat  milk / 143 mg  2 slices whole-wheat toast / 270 mg  1 tbs heart-healthy margarine / 153 mg  1 hard-boiled egg / 139 mg  1 small orange / 0 mg Lunch / Sodium (mg)  1 cup raw carrots / 76 mg   cup hummus / 298 mg  1 cup low-fat milk / 143 mg   cup red grapes / 2 mg  1 whole-wheat pita bread / 356 mg Dinner / Sodium (mg)  1 cup whole-wheat pasta / 2 mg  1 cup low-sodium tomato sauce / 73 mg  3 oz lean ground beef / 57  mg  1 small side salad (1 cup raw spinach leaves,  cup cucumber,  cup yellow bell pepper) with 1 tsp olive oil and 1 tsp red wine vinegar / 25 mg Snack / Sodium (mg)  1 container low-fat vanilla yogurt / 107 mg  3 graham cracker squares / 127 mg Nutrient Analysis  Calories: 2033  Protein: 77 g  Carbohydrate: 282 g  Fat: 72 g  Sodium: 1971 mg Document Released: 09/16/2005 Document Revised: 12/09/2011 Document Reviewed: 12/18/2009 [facility] Patient Information 2013 [place], [place].

## 2012-11-30 NOTE — Progress Notes (Signed)
Original documentation done under V. Aiden Rao's name while Dr. Lynelle Doctor had no computer access.  See Dr. Delford Field note

## 2012-11-30 NOTE — Progress Notes (Signed)
Chief Complaint  Patient presents with  . Hypertension    6 month follow up on htn.   HPI:  He has lost 3 pounds in the last 6 months.  Walking at least a mile a day, and working out at [employer].  Doesn't add salt, but eats out frequently. Has soup frequently at [facility].  BP's at home are routinely running 140's/80's.  He denies headaches, chest pain, palpitations, edema, shortness of breath or other concerns.  Denies cough.  Allergies are well controlled with nasal steroid spray. No fevers, purulence, sinus pain.  Past Medical History  Diagnosis Date  . Hypertension 2012  . Allergic rhinitis, cause unspecified     seasonal  . Herpes labialis     (prev treated by Dr. Yetta Barre with Valtrex 1 gm)  . Anxiety state, unspecified     resolved, related to death of nephew  . Diverticulosis 10/13/07    on colonoscopy (Dr. Elnoria Howard)  . Hemorrhoids 10/13/07   No past surgical history on file. History   Social History  . Marital Status: Married    Spouse Name: N/A    Number of Children: 2  . 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 Smoking status: Former Smoker -- 1.00 packs/day for 30 years    Types: Cigarettes    Quit date: 09/30/1998  . Smokeless tobacco: Never Used  . Alcohol Use: Yes     Comment: 2-4 drinks per week.  . Drug Use: No  . Sexually Active: Yes -- Male partner(s)   Other Topics Concern  . Not on file   Social History Narrative   Married.  Son is an air [employer] MD in [place] [employer]).  Daughter lives in [place]. 1 cat. 1 granddaughter.               Current outpatient prescriptions:ALPRAZolam (XANAX) 1 MG tablet, Take 1 mg by mouth as needed. , Disp: , Rfl: ;  fluticasone (FLONASE) 50 MCG/ACT nasal spray, PLACE 2 SPRAYS INTO THE NOSE DAILY., Disp: 16 g, Rfl: 6;  lisinopril (PRINIVIL,ZESTRIL) 10 MG tablet, Take 1 tablet (10 mg total) by mouth daily., Disp: 30 tablet,  Rfl: 5 valACYclovir (VALTREX) 1000 MG tablet, Take 2 tablets at onset of cold sore.  Repeat once in 12 hours (total of 4 tablets per course), Disp: 30 tablet, Rfl: 0  No Known Allergies  ROS:  Denies fevers, URI symptoms, cough, shortness of breath, headaches, dizziness, chest pain, edema, skin rash, GI complaints, anxiety or other concerns.  PHYSICAL EXAM: BP 152/98  Pulse 64  Ht 5\' 10"  (1.778 m)  Wt 207 lb (93.895 kg)  BMI 29.7 kg/m2 Pleasant male in no distress Neck: no lymphadenopathy, thyromegaly or carotid bruit Heart: regular rate and rhythm without murmur Lungs: clear bilaterally Abdomen: soft, nontender Extremities: no edema, 2+ pulse Neuro: alert and oriented.  Normal gait, strength, cranial nerves grossly intact Psych: normal mood, affect, hygiene and grooming  ASSESSMENT/PLAN: Essential hypertension, benign - Plan: lisinopril (PRINIVIL,ZESTRIL) 20 MG tablet, Basic metabolic panel  Encounter for long-term (current) use of other medications - Plan: Basic metabolic panel  suboptimal control of blood pressure.  Goals reviewed.  Encouraged continued daily exercise and weight loss.  Reviewed low sodium diet, and discussed salt in restaurant foods (ie soups, etc).  Increase lisinopril to 20mg .  Return in 4 weeks for b-met--lab visit, but bring in list of BP's being checked  at home to drop off when he comes.  If BP's improved, then just f/u at CPE in September (6 months).  F/u sooner if not at goal, to discuss changes in meds.

## 2012-12-28 ENCOUNTER — Telehealth: Payer: Self-pay | Admitting: Internal Medicine

## 2012-12-28 ENCOUNTER — Other Ambulatory Visit: Payer: Managed Care, Other (non HMO)

## 2012-12-28 DIAGNOSIS — I10 Essential (primary) hypertension: Secondary | ICD-10-CM

## 2012-12-28 DIAGNOSIS — J309 Allergic rhinitis, unspecified: Secondary | ICD-10-CM

## 2012-12-28 MED ORDER — FLUTICASONE PROPIONATE 50 MCG/ACT NA SUSP: 2.0000 | Freq: Every day | NASAL | Status: DC

## 2012-12-28 MED ORDER — LISINOPRIL 20 MG PO TABS: 20.0000 mg | ORAL_TABLET | Freq: Every day | ORAL | Status: DC

## 2012-12-28 NOTE — Telephone Encounter (Signed)
Done

## 2012-12-30 ENCOUNTER — Other Ambulatory Visit: Payer: Managed Care, Other (non HMO)

## 2012-12-30 DIAGNOSIS — Z79899 Other long term (current) drug therapy: Secondary | ICD-10-CM

## 2012-12-30 DIAGNOSIS — I10 Essential (primary) hypertension: Secondary | ICD-10-CM

## 2012-12-30 LAB — BASIC METABOLIC PANEL WITH GFR
BUN: 10 mg/dL (ref 6–23)
CO2: 26 meq/L (ref 19–32)
Calcium: 9.7 mg/dL (ref 8.4–10.5)
Chloride: 105 meq/L (ref 96–112)
Creat: 0.79 mg/dL (ref 0.50–1.35)
Glucose, Bld: 96 mg/dL (ref 70–99)
Potassium: 4.2 meq/L (ref 3.5–5.3)
Sodium: 141 meq/L (ref 135–145)

## 2013-01-04 ENCOUNTER — Other Ambulatory Visit: Payer: Self-pay | Admitting: *Deleted

## 2013-01-04 DIAGNOSIS — I10 Essential (primary) hypertension: Secondary | ICD-10-CM

## 2013-01-04 MED ORDER — LISINOPRIL 20 MG PO TABS: 20.0000 mg | ORAL_TABLET | Freq: Every day | ORAL | Status: DC

## 2013-06-02 ENCOUNTER — Encounter: Payer: Self-pay | Admitting: Family Medicine

## 2013-06-02 ENCOUNTER — Ambulatory Visit (INDEPENDENT_AMBULATORY_CARE_PROVIDER_SITE_OTHER): Payer: Managed Care, Other (non HMO) | Admitting: Family Medicine

## 2013-06-02 VITALS — BP 140/84 | HR 72 | Ht 69.0 in | Wt 198.0 lb

## 2013-06-02 DIAGNOSIS — J309 Allergic rhinitis, unspecified: Secondary | ICD-10-CM | POA: Insufficient documentation

## 2013-06-02 DIAGNOSIS — F411 Generalized anxiety disorder: Secondary | ICD-10-CM | POA: Insufficient documentation

## 2013-06-02 DIAGNOSIS — Z0000 Encounter for general adult medical examination without abnormal findings: Secondary | ICD-10-CM

## 2013-06-02 DIAGNOSIS — Z125 Encounter for screening for malignant neoplasm of prostate: Secondary | ICD-10-CM

## 2013-06-02 DIAGNOSIS — B009 Herpesviral infection, unspecified: Secondary | ICD-10-CM

## 2013-06-02 DIAGNOSIS — B001 Herpesviral vesicular dermatitis: Secondary | ICD-10-CM

## 2013-06-02 DIAGNOSIS — I10 Essential (primary) hypertension: Secondary | ICD-10-CM

## 2013-06-02 DIAGNOSIS — Z23 Encounter for immunization: Secondary | ICD-10-CM

## 2013-06-02 LAB — POCT URINALYSIS DIPSTICK
Bilirubin, UA: NEGATIVE
Blood, UA: NEGATIVE
Ketones, UA: NEGATIVE
Leukocytes, UA: NEGATIVE
Protein, UA: NEGATIVE
Spec Grav, UA: <=1.005
pH, UA: 7

## 2013-06-02 MED ORDER — ALPRAZOLAM 1 MG PO TABS: 0.5000 mg | ORAL_TABLET | Freq: Three times a day (TID) | ORAL | Status: DC | PRN

## 2013-06-02 MED ORDER — LISINOPRIL 20 MG PO TABS: 20.0000 mg | ORAL_TABLET | Freq: Every day | ORAL | Status: DC

## 2013-06-02 MED ORDER — FLUTICASONE PROPIONATE 50 MCG/ACT NA SUSP: 2.0000 | Freq: Every day | NASAL | Status: DC

## 2013-06-02 MED ORDER — VALACYCLOVIR HCL 1 G PO TABS: ORAL_TABLET | ORAL | Status: DC

## 2013-06-02 MED ORDER — AZITHROMYCIN 250 MG PO TABS: ORAL_TABLET | ORAL | Status: DC

## 2013-06-02 NOTE — Progress Notes (Signed)
Chief Complaint  Patient presents with  . Annual Exam    fasting annual exam, no vision exam as he just had one with Dr.McQuen. No major concerns.    Tyler Martin is a 57 y.o. male who presents for a complete physical.  He has the following concerns:  Hypertension follow-up:  Blood pressures elsewhere are 110-140/70's, often 130's/70's.  110/76 this morning.  Denies dizziness, headaches, chest pain.  Denies side effects of medications.   Allergies:  Well controlled with Flonase, needs refill.  Used Claritin D recently when ear was feeling "clogged up".  Uses claritin prn.  Herpes Labialis:  Infrequent flare.  Needs refill.  Needed about 3 times (often related to travel, sun).  Anxiety--he is requesting refill on xanax.  He found out that his flight surgeon son will be deploying soon to the middle [place].  He is anxious about this.  His daughter-in-law and granddaughter will be moving in with them while son is away. They have done some remodeling in anticipation of this.  He still needs to work on babyproofing the house for the 31 month old (and also bringing their dog).  He has had some trouble sleeping, and feeling more anxious, especially when talking about this with them over the weekend.  Needed to take xanax 3 times over the lat week, but feeling a little better since they left.   He is going to [place] soon, and requesting rx for antibiotic to bring in case he gets sick.  Requesting Z-pak.  Never needed the amoxacillin previously given.  Immunization History  Administered Date(s) Administered  . Influenza Split 06/11/2012  . Tdap 08/30/2010   gets flu shots yearly  Last colonoscopy: 10/2007  Last PSA: 1 year ago  Dentist: twice yearly  Ophtho: yearly  Exercise: 5-6 days/week--weights, walking  Past Medical History  Diagnosis Date  . Hypertension 2012  . Allergic rhinitis, cause unspecified     seasonal  . Herpes labialis     (prev treated by Dr. Yetta Barre with Valtrex 1 gm)   . Anxiety state, unspecified     resolved, related to death of nephew  . Diverticulosis 10/13/07    on colonoscopy (Dr. Elnoria Howard)  . Hemorrhoids 10/13/07    History reviewed. No pertinent past surgical history.  History   Social History  . Marital Status: Married    Spouse Name: N/A    Number of Children: 2  . 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 Smoking status: Former Smoker -- 1.00 packs/day for 30 years    Types: Cigarettes    Quit date: 09/30/1998  . Smokeless tobacco: Never Used  . Alcohol Use: Yes     Comment: 2-4 drinks per week.  . Drug Use: No  . Sexual Activity: Yes    Partners: Female   Other Topics Concern  . Not on file   Social History Narrative   Married.  Son is an air [employer] MD in [place] [employer]).  Daughter lives in [place]. 1 cat. 1 granddaughter [name])                   Family History  Problem Relation Age of Onset  . Stroke Mother   . Hypertension Mother   . Parkinsonism Mother   . Parkinsonism Father   . Heart disease Father 33    MI  . COPD Maternal Grandfather   . Diabetes  Neg Hx   . Cancer Maternal Uncle     prostate    Current outpatient prescriptions:ALPRAZolam (XANAX) 1 MG tablet, Take 0.5-1 tablets (0.5-1 mg total) by mouth 3 (three) times daily as needed for sleep or anxiety., Disp: 30 tablet, Rfl: 0;  fluticasone (FLONASE) 50 MCG/ACT nasal spray, Place 2 sprays into the nose daily., Disp: 48 g, Rfl: 3;  lisinopril (PRINIVIL,ZESTRIL) 20 MG tablet, Take 1 tablet (20 mg total) by mouth daily., Disp: 90 tablet, Rfl: 3 loratadine (CLARITIN) 10 MG tablet, Take 10 mg by mouth daily., Disp: , Rfl: ;  azithromycin (ZITHROMAX) 250 MG tablet, Take 2 tablets by mouth on day 1, and one tablet daily on days 2-5.  Take if needed for sinus infection/bronchitis, Disp: 6 tablet, Rfl: 0;  valACYclovir (VALTREX) 1000 MG tablet, Take 2 tablets at onset of  cold sore.  Repeat once in 12 hours (total of 4 tablets per course), Disp: 30 tablet, Rfl: 0  No Known Allergies  ROS: The patient denies anorexia, fever, headaches, vision loss, decreased hearing, ear pain, hoarseness, chest pain, palpitations, dizziness, syncope, dyspnea on exertion, cough, swelling, nausea, vomiting, diarrhea, constipation, abdominal pain, melena, hematochezia, indigestion/heartburn, hematuria, incontinence, erectile dysfunction, nocturia, weakened urine stream, dysuria, genital lesions, joint pains, numbness, tingling, weakness, tremor, suspicious skin lesions, depression, anxiety, abnormal bleeding/bruising, or enlarged lymph nodes  +intentional weight loss--down 10- pounds and 3 inches. +increased anxiety related to son's upcoming deployment.  Needed to use xanax 3 times over the last week.  PHYSICAL EXAM: BP 158/96  Pulse 72  Ht 5\' 9"  (1.753 m)  Wt 198 lb (89.812 kg)  BMI 29.23 kg/m2 140/84 on repeat by MD, RA General Appearance:  Alert, cooperative, no distress, appears stated age   Head:  Normocephalic, without obvious abnormality, atraumatic   Eyes:  PERRL, conjunctiva/corneas clear, EOM's intact, fundi  benign   Ears:  Normal TM's and external ear canals   Nose:  Nares normal, mucosa normal, no drainage or sinus tenderness   Throat:  Lips, mucosa, and tongue normal; teeth and gums normal   Neck:  Supple, no lymphadenopathy; thyroid: no enlargement/tenderness/nodules; no carotid  bruit or JVD   Back:  Spine nontender, no curvature, ROM normal, no CVA tenderness   Lungs:  Clear to auscultation bilaterally without wheezes, rales or ronchi; respirations unlabored   Chest Wall:  No tenderness or deformity   Heart:  Regular rate and rhythm, S1 and S2 normal, no murmur, rub  or gallop   Breast Exam:  No chest wall tenderness, masses or gynecomastia   Abdomen:  Soft, non-tender, obese, nondistended, normoactive bowel sounds,  no masses, no hepatosplenomegaly    Genitalia:  Normal male external genitalia without lesions. Testicles without masses. No inguinal hernias.   Rectal:  Normal sphincter tone, no masses or tenderness; guaiac negative stool. Prostate smooth, no nodules, not enlarged.   Extremities:  No clubbing, cyanosis or edema   Pulses:  2+ and symmetric all extremities   Skin:  Skin color, texture, turgor normal, no rashes or lesions; tanned, with some actinic changes.  No suspicious lesions  Lymph nodes:  Cervical, supraclavicular, and axillary nodes normal   Neurologic:  CNII-XII intact, normal strength, sensation and gait; reflexes 2+ and symmetric throughout          Psych: Normal hygiene and grooming, speech and eye contact.  Mildly anxious, full range of affect.  ASSESSMENT/PLAN:  Routine general medical examination at a health care facility - Plan: POCT Urinalysis Dipstick  Essential hypertension, benign - Plan: lisinopril (PRINIVIL,ZESTRIL) 20 MG tablet, Comprehensive metabolic panel  Need for prophylactic vaccination and inoculation against influenza - Plan: Flu Vaccine QUAD 36+ mos IM  Allergic rhinitis - Plan: fluticasone (FLONASE) 50 MCG/ACT nasal spray  Herpes labialis - Plan: valACYclovir (VALTREX) 1000 MG tablet  Anxiety state, unspecified - Plan: ALPRAZolam (XANAX) 1 MG tablet  Special screening for malignant neoplasm of prostate - Plan: PSA   Discussed PSA screening (risks/benefits), recommended at least 30 minutes of aerobic activity at least 5 days/week; proper sunscreen use reviewed; healthy diet and alcohol recommendations (less than or equal to 2 drinks/day) reviewed; regular seatbelt use; changing batteries in smoke detectors. Self-testicular exams. Immunization recommendations discussed--annual flu shots, shingles vaccine (if covered). Colonoscopy recommendations reviewed--UTD   Consider Shingles--check insurance prior to physical next year Consider SSRI if increasing anxiety.  Advised to return to discuss.   Continue stress reduction techniques, exercise.

## 2013-06-02 NOTE — Patient Instructions (Signed)
HEALTH MAINTENANCE RECOMMENDATIONS:  It is recommended that you get at least 30 minutes of aerobic exercise at least 5 days/week (for weight loss, you may need as much as 60-90 minutes). This can be any activity that gets your heart rate up. This can be divided in 10-15 minute intervals if needed, but try and build up your endurance at least once a week.  Weight bearing exercise is also recommended twice weekly.  Eat a healthy diet with lots of vegetables, fruits and fiber.  "Colorful" foods have a lot of vitamins (ie green vegetables, tomatoes, red peppers, etc).  Limit sweet tea, regular sodas and alcoholic beverages, all of which has a lot of calories and sugar.  Up to 2 alcoholic drinks daily may be beneficial for men (unless trying to lose weight, watch sugars).  Drink a lot of water.  Sunscreen of at least SPF 30 should be used on all sun-exposed parts of the skin when outside between the hours of 10 am and 4 pm (not just when at beach or pool, but even with exercise, golf, tennis, and yard work!)  Use a sunscreen that says "broad spectrum" so it covers both UVA and UVB rays, and make sure to reapply every 1-2 hours.  Remember to change the batteries in your smoke detectors when changing your clock times in the spring and fall.  Use your seat belt every time you are in a car, and please drive safely and not be distracted with cell phones and texting while driving.  Check with your insurance prior to your next physical (or sooner, if desired) to see if shingles vaccine is covered prior to the age of 47 (zostavax).  Please return to discuss preventative medications if you feel that your anxiety is persistent/worsening, needing frequent use of alprazolam.

## 2013-06-03 ENCOUNTER — Encounter: Payer: Self-pay | Admitting: Family Medicine

## 2013-06-03 LAB — PSA: PSA: 0.84 ng/mL (ref <=4.00)

## 2013-06-03 LAB — COMPREHENSIVE METABOLIC PANEL
ALT: 17 U/L (ref 0–53)
AST: 16 U/L (ref 0–37)
Alkaline Phosphatase: 72 U/L (ref 39–117)
CO2: 28 mEq/L (ref 19–32)
Creat: 0.88 mg/dL (ref 0.50–1.35)
Sodium: 140 mEq/L (ref 135–145)
Total Bilirubin: 0.7 mg/dL (ref 0.3–1.2)
Total Protein: 7.4 g/dL (ref 6.0–8.3)

## 2014-05-23 ENCOUNTER — Other Ambulatory Visit: Payer: Self-pay | Admitting: Family Medicine

## 2014-06-02 ENCOUNTER — Ambulatory Visit (INDEPENDENT_AMBULATORY_CARE_PROVIDER_SITE_OTHER): Payer: BC Managed Care – PPO | Admitting: Family Medicine

## 2014-06-02 ENCOUNTER — Encounter: Payer: Self-pay | Admitting: Family Medicine

## 2014-06-02 VITALS — BP 134/86 | HR 72 | Ht 70.0 in | Wt 200.0 lb

## 2014-06-02 DIAGNOSIS — Z23 Encounter for immunization: Secondary | ICD-10-CM

## 2014-06-02 DIAGNOSIS — B009 Herpesviral infection, unspecified: Secondary | ICD-10-CM

## 2014-06-02 DIAGNOSIS — B001 Herpesviral vesicular dermatitis: Secondary | ICD-10-CM

## 2014-06-02 DIAGNOSIS — Z125 Encounter for screening for malignant neoplasm of prostate: Secondary | ICD-10-CM

## 2014-06-02 DIAGNOSIS — J309 Allergic rhinitis, unspecified: Secondary | ICD-10-CM

## 2014-06-02 DIAGNOSIS — I10 Essential (primary) hypertension: Secondary | ICD-10-CM

## 2014-06-02 DIAGNOSIS — Z0000 Encounter for general adult medical examination without abnormal findings: Secondary | ICD-10-CM

## 2014-06-02 DIAGNOSIS — F411 Generalized anxiety disorder: Secondary | ICD-10-CM

## 2014-06-02 LAB — CBC WITH DIFFERENTIAL/PLATELET
BASOS ABS: 0.1 10*3/uL (ref 0.0–0.1)
Basophils Relative: 1 % (ref 0–1)
Eosinophils Absolute: 0.1 10*3/uL (ref 0.0–0.7)
Eosinophils Relative: 2 % (ref 0–5)
HEMATOCRIT: 45 % (ref 39.0–52.0)
Hemoglobin: 15.5 g/dL (ref 13.0–17.0)
LYMPHS PCT: 31 % (ref 12–46)
Lymphs Abs: 1.8 10*3/uL (ref 0.7–4.0)
MCH: 30 pg (ref 26.0–34.0)
MCHC: 34.4 g/dL (ref 30.0–36.0)
MCV: 87.2 fL (ref 78.0–100.0)
MONO ABS: 0.5 10*3/uL (ref 0.1–1.0)
Monocytes Relative: 8 % (ref 3–12)
NEUTROS ABS: 3.4 10*3/uL (ref 1.7–7.7)
NEUTROS PCT: 58 % (ref 43–77)
Platelets: 223 10*3/uL (ref 150–400)
RBC: 5.16 MIL/uL (ref 4.22–5.81)
RDW: 13.2 % (ref 11.5–15.5)
WBC: 5.9 10*3/uL (ref 4.0–10.5)

## 2014-06-02 LAB — LIPID PANEL
CHOLESTEROL: 161 mg/dL (ref 0–200)
HDL: 52 mg/dL (ref >39)
LDL Cholesterol: 86 mg/dL (ref 0–99)
TRIGLYCERIDES: 116 mg/dL (ref <150)
Total CHOL/HDL Ratio: 3.1 Ratio
VLDL: 23 mg/dL (ref 0–40)

## 2014-06-02 LAB — POCT URINALYSIS DIPSTICK
Bilirubin, UA: NEGATIVE
Blood, UA: NEGATIVE
Glucose, UA: NEGATIVE
Ketones, UA: NEGATIVE
LEUKOCYTES UA: NEGATIVE
Nitrite, UA: NEGATIVE
PH UA: 7
PROTEIN UA: NEGATIVE
Spec Grav, UA: 1.015
Urobilinogen, UA: NEGATIVE

## 2014-06-02 LAB — COMPREHENSIVE METABOLIC PANEL
ALT: 20 U/L (ref 0–53)
AST: 19 U/L (ref 0–37)
Albumin: 4.4 g/dL (ref 3.5–5.2)
Alkaline Phosphatase: 68 U/L (ref 39–117)
BUN: 13 mg/dL (ref 6–23)
CALCIUM: 9.4 mg/dL (ref 8.4–10.5)
CHLORIDE: 102 meq/L (ref 96–112)
CO2: 25 meq/L (ref 19–32)
CREATININE: 0.79 mg/dL (ref 0.50–1.35)
Glucose, Bld: 101 mg/dL — ABNORMAL HIGH (ref 70–99)
Potassium: 4.2 mEq/L (ref 3.5–5.3)
Sodium: 137 mEq/L (ref 135–145)
Total Bilirubin: 0.9 mg/dL (ref 0.2–1.2)
Total Protein: 7.2 g/dL (ref 6.0–8.3)

## 2014-06-02 LAB — TSH: TSH: 1.123 u[IU]/mL (ref 0.350–4.500)

## 2014-06-02 MED ORDER — FLUTICASONE PROPIONATE 50 MCG/ACT NA SUSP: NASAL | Status: DC

## 2014-06-02 MED ORDER — VALACYCLOVIR HCL 1 G PO TABS: ORAL_TABLET | ORAL | Status: DC

## 2014-06-02 MED ORDER — LISINOPRIL 20 MG PO TABS: 20.0000 mg | ORAL_TABLET | Freq: Every day | ORAL | Status: DC

## 2014-06-02 NOTE — Progress Notes (Signed)
Chief Complaint  Patient presents with  . Annual Exam    fasting annual exam, no concerns. Did not do eye exam, just had one recently with Dr.McQueen. Could not give urine sample.    Tyler Martin is a 58 y.o. male who presents for a complete physical.  He is also here for follow-up on hypertension, and needing some med refills.  Hypertension follow-up: Blood pressures elsewhere are 130's-140/82-88.  Numbers are lower at pharmacy than at home. Denies dizziness, headaches, chest pain. Denies side effects of medications.   Allergies: Well controlled with Flonase, needs refill. Used Claritin just as needed--just starting to get worse.   Herpes Labialis: Infrequent flare. Needs refill.  Flares are usually related to travel, sun.  Anxiety--overall is much improved.  Used xanax some to help him around the time of his mother's death in 11/03/2022.  He now uses 1/2 tablet about 2x/week to help with sleep, usually related to travel.  He has about 10-12 pills left (and will call when refill is needed)  Immunization History  Administered Date(s) Administered  . Influenza Split 06/11/2012  . Influenza,inj,Quad PF,36+ Mos 06/02/2013, 06/02/2014  . Tdap 08/30/2010   Last colonoscopy: 11-04-2007, due again 2019 Last PSA: 1 year ago  Dentist: twice yearly  Ophtho: yearly  Exercise:Daily--weights 5x/week, and walking--getting 10,000 steps daily, including at least 30 minutes of cardio daily. Golf frequently.  Past Medical History  Diagnosis Date  . Hypertension 2012  . Allergic rhinitis, cause unspecified     seasonal  . Herpes labialis     (prev treated by Dr. Yetta Barre with Valtrex 1 gm)  . Anxiety state, unspecified     resolved, related to death of nephew  . Diverticulosis 10/13/07    on colonoscopy (Dr. Elnoria Howard)  . Hemorrhoids 10/13/07    History reviewed. No pertinent past surgical history.  History   Social History  . Marital Status: Married    Spouse Name: N/A    Number of Children: 2   . 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 Smoking status: Former Smoker -- 1.00 packs/day for 30 years    Types: Cigarettes    Quit date: 09/30/1998  . Smokeless tobacco: Never Used  . Alcohol Use: Yes     Comment: 2-4 drinks per week.  . Drug Use: No  . Sexual Activity: Yes    Partners: Female   Other Topics Concern  . Not on file   Social History Narrative   Married.  Son is an air [employer] MD in [place] (interventional radiology).  Daughter lives in [place]. 1 cat. 1 granddaughter ([place])                      Family History  Problem Relation Age of Onset  . Stroke Mother   . Hypertension Mother   . Parkinsonism Mother   . Parkinsonism Father   . Heart disease Father 49    MI  . COPD Maternal Grandfather   . Diabetes Neg Hx   . Cancer Maternal Uncle     prostate   Outpatient Encounter Prescriptions as of 06/02/2014  Medication Sig Note  . fluticasone (FLONASE) 50 MCG/ACT nasal spray PLACE 2 SPRAYS INTO THE NOSE DAILY.   Marland Kitchen lisinopril (PRINIVIL,ZESTRIL) 20 MG tablet Take 1 tablet (20 mg total) by mouth daily.   Marland Kitchen loratadine (CLARITIN) 10 MG tablet Take 10 mg by mouth  daily. 06/02/2013: Uses prn  . ALPRAZolam (XANAX) 1 MG tablet Take 0.5-1 tablets (0.5-1 mg total) by mouth 3 (three) times daily as needed for sleep or anxiety. 06/02/2014: Infrequent use, 1/2 tablet if he can't sleep, maybe 2x/month, often related to travel  . azithromycin (ZITHROMAX) 250 MG tablet Take 2 tablets by mouth on day 1, and one tablet daily on days 2-5.  Take if needed for sinus infection/bronchitis 06/02/2014: Never needed--takes it with him when he travels in case he gets sick  . valACYclovir (VALTREX) 1000 MG tablet Take 2 tablets at onset of cold sore.  Repeat once in 12 hours (total of 4 tablets per course) 06/02/2014: Uses prn flares, none recently   No Known Allergies  ROS: The patient denies anorexia, weight  changes, fever, headaches, vision loss, decreased hearing, ear pain, hoarseness, chest pain, palpitations, dizziness, syncope, dyspnea on exertion, cough, swelling, nausea, vomiting, diarrhea, constipation, abdominal pain, melena, hematochezia, indigestion/heartburn, hematuria, incontinence, erectile dysfunction, nocturia, weakened urine stream, dysuria, genital lesions, joint pains, numbness, tingling, weakness, tremor, suspicious skin lesions, depression, abnormal bleeding/bruising, or enlarged lymph nodes.  Some trouble sleeping, shutting mind down, usually with travel; xanax helps prn.  PHYSICAL EXAM:  BP 140/88  Pulse 72  Ht  (1.778 m)  Wt 200 lb (90.719 kg)  BMI 28.70 kg/m2 134/86 on repeat by MD, RA  General Appearance:  Alert, cooperative, no distress, appears stated age   Head:  Normocephalic, without obvious abnormality, atraumatic   Eyes:  PERRL, conjunctiva/corneas clear, EOM's intact, fundi  benign   Ears:  Normal TM's and external ear canals   Nose:  Nares normal, mucosa normal, no drainage or sinus tenderness   Throat:  Lips, mucosa, and tongue normal; teeth and gums normal   Neck:  Supple, no lymphadenopathy; thyroid: no enlargement/tenderness/nodules; no carotid  bruit or JVD   Back:  Spine nontender, no curvature, ROM normal, no CVA tenderness   Lungs:  Clear to auscultation bilaterally without wheezes, rales or ronchi; respirations unlabored   Chest Wall:  No tenderness or deformity   Heart:  Regular rate and rhythm, S1 and S2 normal, no murmur, rub  or gallop   Breast Exam:  No chest wall tenderness, masses or gynecomastia   Abdomen:  Soft, non-tender, obese, nondistended, normoactive bowel sounds, no masses, no hepatosplenomegaly. Some abdominal obesity  Genitalia:  Normal male external genitalia without lesions. Testicles without masses. No inguinal hernias.   Rectal:  Normal sphincter tone, no masses or tenderness; guaiac negative stool. Prostate smooth, no  nodules, not significantly enlarged.   Extremities:  No clubbing, cyanosis or edema   Pulses:  2+ and symmetric all extremities   Skin:  Skin color, texture, turgor normal, no rashes or lesions; tanned, with some actinic changes. No suspicious lesions   Lymph nodes:  Cervical, supraclavicular, and axillary nodes normal   Neurologic:  CNII-XII intact, normal strength, sensation and gait; reflexes 2+ and symmetric throughout          Psych: Normal mood, affect, hygiene and grooming, speech and eye contact.    ASSESSMENT/PLAN:  Routine general medical examination at a health care facility - Plan: Lipid panel, Comprehensive metabolic panel, CBC with Differential, TSH, PSA, POCT Urinalysis Dipstick  Need for prophylactic vaccination and inoculation against influenza - Plan: Flu Vaccine QUAD 36+ mos PF IM (Fluarix Quad PF)  Essential hypertension, benign - borderline control.  Encouraged further weight loss, low sodium diet.  Continue monitoring BP and f/u if consistently >140/90 -  Plan: Lipid panel, Comprehensive metabolic panel, CBC with Differential, lisinopril (PRINIVIL,ZESTRIL) 20 MG tablet  Allergic rhinitis, cause unspecified - controlled; will need to start daily antihistamine soon as fall allergies worsen  Herpes labialis - Plan: valACYclovir (VALTREX) 1000 MG tablet  Anxiety state, unspecified - mild/infrequent.  Will call when xanax refill is needed.  Not recommended to use regularly for sleep  Special screening for malignant neoplasm of prostate - Plan: PSA  Discussed PSA screening (risks/benefits), recommended at least 30 minutes of aerobic activity at least 5 days/week; proper sunscreen use reviewed; healthy diet and alcohol recommendations (less than or equal to 2 drinks/day) reviewed; regular seatbelt use; changing batteries in smoke detectors. Self-testicular exams. Immunization recommendations discussed-- flu shot given. Colonoscopy recommendations reviewed--UTD    F/u 1 year,  sooner prn.

## 2014-06-02 NOTE — Patient Instructions (Signed)

## 2014-06-03 LAB — PSA: PSA: 1.61 ng/mL (ref <=4.00)

## 2014-11-02 ENCOUNTER — Telehealth: Payer: Self-pay | Admitting: Family Medicine

## 2014-11-02 ENCOUNTER — Telehealth: Payer: Self-pay | Admitting: *Deleted

## 2014-11-02 MED ORDER — CEPHALEXIN 500 MG PO CAPS
500.0000 mg | ORAL_CAPSULE | Freq: Three times a day (TID) | ORAL | Status: DC
Start: 2014-11-02 — End: 2015-06-07

## 2014-11-02 NOTE — Telephone Encounter (Signed)
Error

## 2014-11-02 NOTE — Telephone Encounter (Signed)
Please call    Patient would like to speak to someone about a medical condition, would not give any more information

## 2014-11-02 NOTE — Telephone Encounter (Signed)
Ok for keflex 500mg  TID x 10d, #30

## 2014-11-02 NOTE — Telephone Encounter (Signed)
I called patient back-he is in [place] on business and will not be back until Friday 2/19, 2016. Got a haircut before he left and has a cyst-like place on his neck. Has been doing warm compresses without any relief. Was seen 02/21/12 and was given Keflex which worked well. Wants to know if you would call in this one time for him since he is out of town-will follow up when he gets back if needed.  [facility] [address] [phone]

## 2014-11-02 NOTE — Telephone Encounter (Signed)
Sent. Pt aware. Had to change pharmacy location as the one given did not have a pharmacy. [facility]

## 2014-12-29 ENCOUNTER — Ambulatory Visit (INDEPENDENT_AMBULATORY_CARE_PROVIDER_SITE_OTHER): Payer: BLUE CROSS/BLUE SHIELD | Admitting: Family Medicine

## 2014-12-29 ENCOUNTER — Encounter: Payer: Self-pay | Admitting: Family Medicine

## 2014-12-29 VITALS — BP 150/90 | HR 92 | Temp 99.0°F | Wt 207.0 lb

## 2014-12-29 DIAGNOSIS — J301 Allergic rhinitis due to pollen: Secondary | ICD-10-CM | POA: Diagnosis not present

## 2014-12-29 MED ORDER — TRIAMCINOLONE ACETONIDE 55 MCG/ACT NA AERO: 2.0000 | INHALATION_SPRAY | Freq: Every day | NASAL | Status: DC

## 2014-12-29 MED ORDER — MONTELUKAST SODIUM 10 MG PO TABS: 10.0000 mg | ORAL_TABLET | Freq: Every day | ORAL | Status: DC

## 2014-12-29 NOTE — Patient Instructions (Addendum)
Switch to Allegra.

## 2014-12-29 NOTE — Progress Notes (Signed)
   Subjective:    Patient ID: Tyler Martin, male    DOB: 04/01/1956, 59 y.o.   MRN: [id number]  HPI He is here for consult concerning difficulty with sneezing, itchy watery eyes, PND, dry cough, right ear congestion. No fever, chills, sore throat. He does have a history of allergies especially in the spring. He has been using Claritin as well as Flonase without much success. He does spend a lot of time outside.   Review of Systems     Objective:   Physical Exam Alert and in no distress. Tympanic membranes and canals are normal. Pharyngeal area is normal. Neck is supple without adenopathy or thyromegaly. Cardiac exam shows a regular sinus rhythm without murmurs or gallops. Lungs are clear to auscultation.        Assessment & Plan:  Allergic rhinitis due to pollen - Plan: triamcinolone (NASACORT AQ) 55 MCG/ACT AERO nasal inhaler, montelukast (SINGULAIR) 10 MG tablet switch to a different nasal steroid spray as well as having him use Allegra. And add Singulair to his regimen. He is to call if further difficulties.

## 2015-06-07 ENCOUNTER — Ambulatory Visit (INDEPENDENT_AMBULATORY_CARE_PROVIDER_SITE_OTHER): Payer: BLUE CROSS/BLUE SHIELD | Admitting: Family Medicine

## 2015-06-07 ENCOUNTER — Encounter: Payer: Self-pay | Admitting: Family Medicine

## 2015-06-07 ENCOUNTER — Other Ambulatory Visit: Payer: Self-pay | Admitting: Family Medicine

## 2015-06-07 VITALS — BP 130/80 | HR 68 | Ht 70.0 in | Wt 203.8 lb

## 2015-06-07 DIAGNOSIS — J309 Allergic rhinitis, unspecified: Secondary | ICD-10-CM

## 2015-06-07 DIAGNOSIS — Z23 Encounter for immunization: Secondary | ICD-10-CM | POA: Diagnosis not present

## 2015-06-07 DIAGNOSIS — B001 Herpesviral vesicular dermatitis: Secondary | ICD-10-CM

## 2015-06-07 DIAGNOSIS — F411 Generalized anxiety disorder: Secondary | ICD-10-CM | POA: Diagnosis not present

## 2015-06-07 DIAGNOSIS — J301 Allergic rhinitis due to pollen: Secondary | ICD-10-CM | POA: Diagnosis not present

## 2015-06-07 DIAGNOSIS — Z0000 Encounter for general adult medical examination without abnormal findings: Secondary | ICD-10-CM

## 2015-06-07 DIAGNOSIS — I10 Essential (primary) hypertension: Secondary | ICD-10-CM | POA: Diagnosis not present

## 2015-06-07 LAB — COMPREHENSIVE METABOLIC PANEL
ALT: 17 U/L (ref 9–46)
AST: 16 U/L (ref 10–35)
Albumin: 4.8 g/dL (ref 3.6–5.1)
Alkaline Phosphatase: 76 U/L (ref 40–115)
BILIRUBIN TOTAL: 0.6 mg/dL (ref 0.2–1.2)
BUN: 12 mg/dL (ref 7–25)
CALCIUM: 9.6 mg/dL (ref 8.6–10.3)
CO2: 25 mmol/L (ref 20–31)
Chloride: 102 mmol/L (ref 98–110)
Creat: 0.72 mg/dL (ref 0.70–1.33)
GLUCOSE: 88 mg/dL (ref 65–99)
Potassium: 4.2 mmol/L (ref 3.5–5.3)
SODIUM: 142 mmol/L (ref 135–146)
Total Protein: 7.7 g/dL (ref 6.1–8.1)

## 2015-06-07 LAB — POCT URINALYSIS DIPSTICK
Bilirubin, UA: NEGATIVE
Blood, UA: NEGATIVE
GLUCOSE UA: NEGATIVE
Ketones, UA: NEGATIVE
Leukocytes, UA: NEGATIVE
NITRITE UA: NEGATIVE
Protein, UA: NEGATIVE
SPEC GRAV UA: 1.01
UROBILINOGEN UA: NEGATIVE
pH, UA: 8

## 2015-06-07 MED ORDER — MONTELUKAST SODIUM 10 MG PO TABS: 10.0000 mg | ORAL_TABLET | Freq: Every day | ORAL | Status: DC

## 2015-06-07 MED ORDER — ALPRAZOLAM 1 MG PO TABS: 0.5000 mg | ORAL_TABLET | Freq: Every evening | ORAL | Status: DC | PRN

## 2015-06-07 NOTE — Patient Instructions (Signed)

## 2015-06-07 NOTE — Progress Notes (Signed)
Chief Complaint  Patient presents with  . Annual Exam    fasting annual exam. Did not do eye exam, he gets one done regularly with Dr.McQuen. No concerns.    Tyler Martin is a 59 y.o. male who presents for a complete physical and follow up on his chronic issues.  Hypertension follow-up: He reports that his blood pressures have been lower than last year, running 130's (132-138)/80-86. Denies dizziness, headaches, chest pain. Denies side effects of medications.   Allergies: He has been taking both Allegra and claritin daily, as well as recently starting back up on the Nasacort.  He has not been using the montelukast, only if he hears himself wheezing at night.  He had a very bad flare in the Spring. Currently his symptoms are controlled with this regimen he has been using. He only recently restarted the nasal steroid spray.  Herpes Labialis: Infrequent flare; still has medication. Flares are usually related to travel, sun, stress.  Anxiety--overall is much improved. He now uses 1/2 tablet intermittently to help with sleep, usually related to travel. Asking for refill.  Immunization History  Administered Date(s) Administered  . Influenza Split 06/11/2012  . Influenza,inj,Quad PF,36+ Mos 06/02/2013, 06/02/2014  . Tdap 08/30/2010   Last colonoscopy: 10/2007, due again 2019 Last PSA: 1 year ago  Dentist: twice yearly  Ophtho: yearly  Exercise: Daily--weights 5x/week, and walking--getting 10,000 steps daily, including at least 30 minutes of cardio daily. Golf frequently. Lipids normal last year: Lab Results  Component Value Date   CHOL 161 06/02/2014   HDL 52 06/02/2014   LDLCALC 86 06/02/2014   TRIG 116 06/02/2014   CHOLHDL 3.1 06/02/2014   Past Medical History  Diagnosis Date  . Hypertension 2012  . Allergic rhinitis, cause unspecified     seasonal  . Herpes labialis     (prev treated by Dr. Ronnald Ramp with Valtrex 1 gm)  . Anxiety state, unspecified     resolved, related  to death of nephew  . Diverticulosis 10/13/07    on colonoscopy (Dr. Benson Norway)  . Hemorrhoids 10/13/07    History reviewed. No pertinent past surgical history.  Social History   Social History  . Marital Status: Married    Spouse Name: N/A  . Number of Children: 2  . 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 Smoking status: Former Smoker -- 1.00 packs/day for 30 years    Types: Cigarettes    Quit date: 09/30/1998  . Smokeless tobacco: Never Used  . Alcohol Use: Yes     Comment: 2-4 drinks per week.  . Drug Use: No  . Sexual Activity:    Partners: Female   Other Topics Concern  . Not on file   Social History Narrative   Married.  Son is an air force MD, now flight doctor for [employer] (and living in [place], with his 2 children).  Daughter lives in [place]. 1 cat.                   Family History  Problem Relation Age of Onset  . Stroke Mother   . Hypertension Mother   . Parkinsonism Mother   . Parkinsonism Father   . Heart disease Father 1    MI  . COPD Maternal Grandfather   . Diabetes Neg Hx   . Cancer Maternal Uncle     prostate    Outpatient Encounter Prescriptions  as of 06/07/2015  Medication Sig Note  . fexofenadine (ALLEGRA) 180 MG tablet Take 180 mg by mouth daily.   Marland Kitchen lisinopril (PRINIVIL,ZESTRIL) 20 MG tablet Take 1 tablet (20 mg total) by mouth daily.   Marland Kitchen loratadine (CLARITIN) 10 MG tablet Take 10 mg by mouth daily. 06/02/2013: Uses prn  . triamcinolone (NASACORT AQ) 55 MCG/ACT AERO nasal inhaler Place 2 sprays into the nose daily.   Marland Kitchen ALPRAZolam (XANAX) 1 MG tablet Take 0.5-1 tablets (0.5-1 mg total) by mouth at bedtime as needed for sleep.   . montelukast (SINGULAIR) 10 MG tablet Take 1 tablet (10 mg total) by mouth at bedtime.   . valACYclovir (VALTREX) 1000 MG tablet Take 2 tablets at onset of cold sore.  Repeat once in 12 hours (total of 4 tablets per course)  (Patient not taking: Reported on 12/29/2014) 06/07/2015: Uses prn cold sore  . [DISCONTINUED] ALPRAZolam (XANAX) 1 MG tablet Take 0.5-1 tablets (0.5-1 mg total) by mouth 3 (three) times daily as needed for sleep or anxiety. (Patient not taking: Reported on 06/07/2015) 06/02/2014: Infrequent use, 1/2 tablet if he can't sleep, maybe 2x/month, often related to travel  . [DISCONTINUED] azithromycin (ZITHROMAX) 250 MG tablet Take 2 tablets by mouth on day 1, and one tablet daily on days 2-5.  Take if needed for sinus infection/bronchitis (Patient not taking: Reported on 12/29/2014) 06/02/2014: Never needed--takes it with him when he travels in case he gets sick  . [DISCONTINUED] cephALEXin (KEFLEX) 500 MG capsule Take 1 capsule (500 mg total) by mouth 3 (three) times daily. (Patient not taking: Reported on 12/29/2014)   . [DISCONTINUED] fluticasone (FLONASE) 50 MCG/ACT nasal spray PLACE 2 SPRAYS INTO THE NOSE DAILY.   . [DISCONTINUED] montelukast (SINGULAIR) 10 MG tablet Take 1 tablet (10 mg total) by mouth at bedtime. (Patient not taking: Reported on 06/07/2015) 06/07/2015: Only uses as needed if wheezing at night   No facility-administered encounter medications on file as of 06/07/2015.    No Known Allergies  ROS: The patient denies anorexia, fever, headaches, vision loss, decreased hearing, ear pain, hoarseness, chest pain, palpitations, dizziness, syncope, dyspnea on exertion, cough, swelling, nausea, vomiting, diarrhea, constipation, abdominal pain, melena, hematochezia, indigestion/heartburn, hematuria, incontinence, erectile dysfunction, nocturia, weakened urine stream, dysuria, genital lesions, joint pains, numbness, tingling, weakness, tremor, suspicious skin lesions, depression, abnormal bleeding/bruising, or enlarged lymph nodes. Some intermittent insomnia, usually with travel; xanax helps prn, as per HPI. Gained 3# in the last year +allergy symptoms (not as bad as in the Spring, but having some symptoms). Some  right thumb pain since he started bowling.  PHYSICAL EXAM:  BP 138/84 mmHg  Pulse 68  Ht 5' 10"  (1.778 m)  Wt 203 lb 12.8 oz (92.443 kg)  BMI 29.24 kg/m2 130/80 on repeat by MD  General Appearance:  Alert, cooperative, no distress, appears stated age   Head:  Normocephalic, without obvious abnormality, atraumatic   Eyes:  PERRL, conjunctiva/corneas clear, EOM's intact, fundi  benign   Ears:  Normal TM's and external ear canals   Nose:  Nares normal, mucosa is mildly edematous, no drainage or sinus tenderness   Throat:  Lips, mucosa, and tongue normal; teeth and gums normal   Neck:  Supple, no lymphadenopathy; thyroid: no enlargement/tenderness/nodules; no carotid  bruit or JVD   Back:  Spine nontender, no curvature, ROM normal, no CVA tenderness   Lungs:  Clear to auscultation bilaterally without wheezes, rales or ronchi; respirations unlabored   Chest Wall:  No tenderness or deformity  Heart:  Regular rate and rhythm, S1 and S2 normal, no murmur, rub  or gallop   Breast Exam:  No chest wall tenderness, masses or gynecomastia   Abdomen:  Soft, non-tender, obese, nondistended, normoactive bowel sounds, no masses, no hepatosplenomegaly. Some abdominal obesity  Genitalia:  Normal male external genitalia without lesions. Testicles without masses. No inguinal hernias.   Rectal:  Normal sphincter tone, no masses or tenderness; guaiac negative stool. Prostate smooth, no nodules, normal size  Extremities:  No clubbing, cyanosis or edema   Pulses:  2+ and symmetric all extremities   Skin:  Skin color, texture, turgor normal, no rashes or lesions; tanned, with some actinic changes. No suspicious lesions   Lymph nodes:  Cervical, supraclavicular, and axillary nodes normal   Neurologic:  CNII-XII intact, normal strength, sensation and gait; reflexes 2+ and symmetric throughout    Psych: Normal mood, affect, hygiene and grooming,  speech and eye contact.          ASSESSMENT/PLAN:  Annual physical exam - Plan: POCT Urinalysis Dipstick  Need for prophylactic vaccination and inoculation against influenza - Plan: Flu Vaccine QUAD 36+ mos PF IM (Fluarix & Fluzone Quad PF)  Essential hypertension, benign - controlled. Weight loss encouraged. Continue present management (meds, exercise) - Plan: Comprehensive metabolic panel  Allergic rhinitis, unspecified allergic rhinitis type - restart montelukast; avoid double anti-histamines; continue nasal steroid spray  Herpes labialis - continue prn Valtrex; call when refills needed  Allergic rhinitis due to pollen - Plan: montelukast (SINGULAIR) 10 MG tablet  Anxiety state - resolved, mostly now insomnia related to travel - Plan: ALPRAZolam (XANAX) 1 MG tablet  Discussed PSA screening (risks/benefits), recommended at least 30 minutes of aerobic activity at least 5 days/week, weight-bearing exercise at least 2x/wk; proper sunscreen use reviewed; healthy diet and alcohol recommendations (less than or equal to 2 drinks/day) reviewed; regular seatbelt use; changing batteries in smoke detectors. Self-testicular exams. Weight loss encouraged. Immunization recommendations discussed-- flu shot given. Colonoscopy recommendations reviewed--UTD   Allergies--counseled re: his current regimen. I do not recommend using two daily anthistamines, but instead restart montelukast.  Can try different antihistamines to see which is most effective. Continue nasal steroids.  c-met, PSA (not fasting--had banana and almond butter 4 hours ago).   (CBC, TSH and lipids were normal last year).  F/u 1 year, sooner prn.

## 2015-12-01 ENCOUNTER — Other Ambulatory Visit: Payer: Self-pay | Admitting: Family Medicine

## 2016-03-20 ENCOUNTER — Other Ambulatory Visit: Payer: Self-pay | Admitting: Family Medicine

## 2016-05-05 ENCOUNTER — Other Ambulatory Visit: Payer: Self-pay | Admitting: Family Medicine

## 2016-05-05 DIAGNOSIS — J301 Allergic rhinitis due to pollen: Secondary | ICD-10-CM

## 2016-05-06 NOTE — Telephone Encounter (Signed)
Has an a cpe in September. Will refill for 30 days

## 2016-06-03 NOTE — Progress Notes (Signed)
Chief Complaint  Patient presents with  . Annual Exam    fasting annual exam. No concerns. Did not do eye exam as he just went to [facility].     Tyler Martin is a 60 y.o. male who presents for a complete physical and follow up on his chronic issues.  He reports that wife has genital HPV (biopsied, benign, external, has already had hysterectomy).  He denies any history of warts or having any lesions.  Hypertension follow-up: He reports that his blood pressures have been 135-148/mid 80's. Denies dizziness, headaches, chest pain. Denies side effects of medications.  Eating some more mediterranean foods lately (olives, feta included, higher in sodium). He continues to exercise regularly, but hasn't lost any weight.  He recently had a lot of trips (fishing in [place], others).  Allergies: He has been taking Zyrtec, montelukast and Nasacort fairly regularly, with good results. Some congestion with recent weather changes. He reports that Nasacort is less expensive OTC than Rx.  Herpes Labialis: Infrequent flare; still has medication. Flares are usually related to travel, sun, stress.  He only had 2 flares in the last year, related to the sun.  Anxiety--overall is much improved. He now uses 1/2 tablet intermittently to help with sleep, usually related to travel. Asking for refill.  Immunization History  Administered Date(s) Administered  . Influenza Split 06/11/2012  . Influenza,inj,Quad PF,36+ Mos 06/02/2013, 06/02/2014, 06/07/2015  . Tdap 08/30/2010   Last colonoscopy: 10/2007, due again 2019 Last PSA: 1 year ago  Dentist: twice yearly  Ophtho: yearly  Exercise: Daily--weights 5x/week, and walking (jogging short distances too) 30-45 minutes at least 5x/week. Golf frequently.  Past Medical History:  Diagnosis Date  . Allergic rhinitis, cause unspecified    seasonal  . Anxiety state, unspecified    resolved, related to death of nephew  . Diverticulosis 10/13/07   on  colonoscopy (Dr. Hung)  . Hemorrhoids 10/13/07  . Herpes labialis    (prev treated by Dr. Jones with Valtrex 1 gm)  . Hypertension 2012    No past surgical history on file.  Social History   Social History  . Marital status: Married    Spouse name: N/A  . Number of children: 2  . Years of education: N/A   Occupational History  . 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 Smoking status: Former Smoker    Packs/day: 1.00    Years: 30.00    Types: Cigarettes    Quit date: 09/30/1998  . Smokeless tobacco: Never Used  . Alcohol use Yes     Comment: 2-4 drinks per week.  . Drug use: No  . Sexual activity: Yes    Partners: Female   Other Topics Concern  . Not on file   Social History Narrative   Married.  Son is an air force MD, now flight doctor for [employer] (and living in [place], with his 2 children).  Daughter lives in [place]. 1 cat.                   Family History  Problem Relation Age of Onset  . Stroke Mother   . Hypertension Mother   . Parkinsonism Mother   . Parkinsonism Father   . Heart disease Father 82    MI  . COPD Maternal Grandfather   . Cancer Maternal Uncle     prostate  . Cancer Cousin     prostate  . Diabetes Neg Hx       Outpatient Encounter Prescriptions as of 06/05/2016  Medication Sig Note  . ALPRAZolam (XANAX) 1 MG tablet Take 0.5-1 tablets (0.5-1 mg total) by mouth at bedtime as needed for sleep. 06/05/2016: Uses 1/2 tablet as needed, usually related to travel (to sleep)  . cetirizine (ZYRTEC) 10 MG tablet Take 10 mg by mouth daily.   . lisinopril (PRINIVIL,ZESTRIL) 20 MG tablet Take 1 tablet (20 mg total) by mouth daily.   . montelukast (SINGULAIR) 10 MG tablet TAKE 1 TABLET (10 MG TOTAL) BY MOUTH AT BEDTIME.   . triamcinolone (NASACORT AQ) 55 MCG/ACT AERO nasal inhaler Place 2 sprays into the nose daily.   . valACYclovir (VALTREX) 1000 MG tablet TAKE 2 TABLETS AT ONSET OF COLD SORE.  REPEAT ONCE IN 12 HOURS (TOTAL OF 4 TABLETS PER COURSE) (Patient not taking: Reported on 06/05/2016)   . [DISCONTINUED] fexofenadine (ALLEGRA) 180 MG tablet Take 180 mg by mouth daily.   . [DISCONTINUED] loratadine (CLARITIN) 10 MG tablet Take 10 mg by mouth daily. 06/02/2013: Uses prn   No facility-administered encounter medications on file as of 06/05/2016.     No Known Allergies   ROS: The patient denies anorexia, fever, headaches, vision loss, decreased hearing, ear pain, hoarseness, chest pain, palpitations, dizziness, syncope, dyspnea on exertion, cough, swelling, nausea, vomiting, diarrhea, constipation, abdominal pain, melena, hematochezia, indigestion/heartburn, hematuria, incontinence, erectile dysfunction, nocturia, weakened urine stream, dysuria, genital lesions, joint pains, numbness, tingling, weakness, tremor, suspicious skin lesions, depression, abnormal bleeding/bruising, or enlarged lymph nodes. Some intermittent insomnia, usually with travel; xanax helps prn, as per HPI. +allergy symptoms (not as bad as in the Spring, but having some nasal congestion currently). Very mild right thumb pain (from bowling). Sees Dr. Jones regularly (derm).  PHYSICAL EXAM:  BP 138/88 (BP Location: Left Arm, Patient Position: Sitting, Cuff Size: Normal)   Pulse 64   Ht 5' 10" (1.778 m)   Wt 204 lb 3.2 oz (92.6 kg)   BMI 29.30 kg/m   BP higher on repeat by MD (152/90)  General Appearance:  Alert, cooperative, no distress, appears stated age   Head:  Normocephalic, without obvious abnormality, atraumatic   Eyes:  PERRL, conjunctiva/corneas clear, EOM's intact, fundi  benign   Ears:  Normal TM's and external ear canals   Nose:  Nares normal, mucosa is mildly edematous, no drainage or sinus tenderness   Throat:  Lips, mucosa, and tongue normal; teeth and gums normal   Neck:  Supple, no lymphadenopathy; thyroid: no enlargement/tenderness/nodules; no carotid  bruit or JVD   Back:   Spine nontender, no curvature, ROM normal, no CVA tenderness   Lungs:  Clear to auscultation bilaterally without wheezes, rales or ronchi; respirations unlabored   Chest Wall:  No tenderness or deformity   Heart:  Regular rate and rhythm, S1 and S2 normal, no murmur, rub  or gallop   Breast Exam:  No chest wall tenderness, masses or gynecomastia   Abdomen:  Soft, non-tender, obese, nondistended, normoactive bowel sounds, no masses, no hepatosplenomegaly. Some abdominal obesity  Genitalia:  Normal male external genitalia without lesions. Testicles without masses. No inguinal hernias.   Rectal:  Normal sphincter tone, no masses or tenderness; guaiac negative stool. Prostate smooth, no nodules, normal size  Extremities:  No clubbing, cyanosis or edema   Pulses:  2+ and symmetric all extremities   Skin:  Skin color, texture, turgor normal, no rashes or lesions; tanned, with some actinic changes. No suspicious lesions (papule at right temple being treated by Dr. Jones)    Lymph nodes:  Cervical, supraclavicular, and axillary nodes normal   Neurologic:  CNII-XII intact, normal strength, sensation and gait; reflexes 2+ and symmetric throughout      Psych:   Normal mood, affect, hygiene and grooming, speech and eye contact   ASSESSMENT/PLAN:  Annual physical exam - Plan: PSA, TSH, CBC with Differential/Platelet, Comprehensive metabolic panel, Lipid panel, POCT Urinalysis Dipstick  Essential hypertension, benign - higher than in the past per pt report. Discussed low sodium diet, portion control, weight loss. Contact us if stays >140 for change in dose - Plan: Comprehensive metabolic panel, Lipid panel, lisinopril (PRINIVIL,ZESTRIL) 20 MG tablet  Allergic rhinitis, unspecified allergic rhinitis type - controlled on current regimen with oral anti-histamine, Nasacort and montelukast; continue  Herpes labialis - infrequent flare. Continue Valtrex prn - Plan: valACYclovir  (VALTREX) 1000 MG tablet  Need for prophylactic vaccination and inoculation against influenza - Plan: Flu Vaccine QUAD 36+ mos PF IM (Fluarix & Fluzone Quad PF)  Screening for prostate cancer - Plan: PSA  Need for hepatitis C screening test - Plan: Hepatitis C antibody  Anxiety state - resolved, mostly now insomnia related to travel - Plan: ALPRAZolam (XANAX) 1 MG tablet  Allergic rhinitis due to pollen, unspecified rhinitis seasonality - Plan: montelukast (SINGULAIR) 10 MG tablet   Discussed PSA screening (risks/benefits), recommended at least 30 minutes of aerobic activity at least 5 days/week, weight-bearing exercise at least 2x/wk; proper sunscreen use reviewed; healthy diet and alcohol recommendations (less than or equal to 2 drinks/day) reviewed; regular seatbelt use; changing batteries in smoke detectors. Self-testicular exams. Weight loss encouraged--discussed portions, cutting back on carbs, more fruits/vegetables.  [place] sodium diet also reviewed. Immunization recommendations discussed-- flu shot given. Check on shingles vaccine coverage prior to physical next year. Colonoscopy recommendations reviewed--UTD   c-met, PSA, TSH, CBC, lipids, Hep C Ab  F/u 6 months, sooner if BP's remain >140 systolic. 

## 2016-06-05 ENCOUNTER — Encounter: Payer: Self-pay | Admitting: Family Medicine

## 2016-06-05 ENCOUNTER — Ambulatory Visit (INDEPENDENT_AMBULATORY_CARE_PROVIDER_SITE_OTHER): Payer: BLUE CROSS/BLUE SHIELD | Admitting: Family Medicine

## 2016-06-05 VITALS — BP 138/88 | HR 64 | Ht 70.0 in | Wt 204.2 lb

## 2016-06-05 DIAGNOSIS — Z125 Encounter for screening for malignant neoplasm of prostate: Secondary | ICD-10-CM

## 2016-06-05 DIAGNOSIS — B001 Herpesviral vesicular dermatitis: Secondary | ICD-10-CM

## 2016-06-05 DIAGNOSIS — Z23 Encounter for immunization: Secondary | ICD-10-CM | POA: Diagnosis not present

## 2016-06-05 DIAGNOSIS — F411 Generalized anxiety disorder: Secondary | ICD-10-CM

## 2016-06-05 DIAGNOSIS — I10 Essential (primary) hypertension: Secondary | ICD-10-CM | POA: Diagnosis not present

## 2016-06-05 DIAGNOSIS — Z0000 Encounter for general adult medical examination without abnormal findings: Secondary | ICD-10-CM

## 2016-06-05 DIAGNOSIS — J309 Allergic rhinitis, unspecified: Secondary | ICD-10-CM | POA: Diagnosis not present

## 2016-06-05 DIAGNOSIS — Z1159 Encounter for screening for other viral diseases: Secondary | ICD-10-CM

## 2016-06-05 DIAGNOSIS — J301 Allergic rhinitis due to pollen: Secondary | ICD-10-CM | POA: Diagnosis not present

## 2016-06-05 LAB — POCT URINALYSIS DIPSTICK
Bilirubin, UA: NEGATIVE
Glucose, UA: NEGATIVE
Ketones, UA: NEGATIVE
Leukocytes, UA: NEGATIVE
NITRITE UA: NEGATIVE
PH UA: 7.5
Protein, UA: NEGATIVE
RBC UA: NEGATIVE
Spec Grav, UA: 1.01
UROBILINOGEN UA: NEGATIVE

## 2016-06-05 LAB — COMPREHENSIVE METABOLIC PANEL
ALBUMIN: 4.6 g/dL (ref 3.6–5.1)
ALT: 19 U/L (ref 9–46)
AST: 17 U/L (ref 10–35)
Alkaline Phosphatase: 64 U/L (ref 40–115)
BILIRUBIN TOTAL: 0.9 mg/dL (ref 0.2–1.2)
BUN: 12 mg/dL (ref 7–25)
CO2: 25 mmol/L (ref 20–31)
CREATININE: 0.87 mg/dL (ref 0.70–1.33)
Calcium: 9.6 mg/dL (ref 8.6–10.3)
Chloride: 101 mmol/L (ref 98–110)
GLUCOSE: 104 mg/dL — AB (ref 65–99)
Potassium: 4 mmol/L (ref 3.5–5.3)
Sodium: 135 mmol/L (ref 135–146)
Total Protein: 7.6 g/dL (ref 6.1–8.1)

## 2016-06-05 LAB — TSH: TSH: 1.24 m[IU]/L (ref 0.40–4.50)

## 2016-06-05 LAB — CBC WITH DIFFERENTIAL/PLATELET
BASOS PCT: 1 %
Basophils Absolute: 55 cells/uL (ref 0–200)
EOS ABS: 55 {cells}/uL (ref 15–500)
EOS PCT: 1 %
HCT: 46.3 % (ref 38.5–50.0)
Hemoglobin: 16 g/dL (ref 13.2–17.1)
LYMPHS PCT: 32 %
Lymphs Abs: 1760 cells/uL (ref 850–3900)
MCH: 30 pg (ref 27.0–33.0)
MCHC: 34.6 g/dL (ref 32.0–36.0)
MCV: 86.7 fL (ref 80.0–100.0)
MONOS PCT: 8 %
MPV: 10.8 fL (ref 7.5–12.5)
Monocytes Absolute: 440 cells/uL (ref 200–950)
Neutro Abs: 3190 cells/uL (ref 1500–7800)
Neutrophils Relative %: 58 %
PLATELETS: 237 10*3/uL (ref 140–400)
RBC: 5.34 MIL/uL (ref 4.20–5.80)
RDW: 13 % (ref 11.0–15.0)
WBC: 5.5 10*3/uL (ref 4.0–10.5)

## 2016-06-05 LAB — LIPID PANEL
Cholesterol: 175 mg/dL (ref 125–200)
HDL: 54 mg/dL (ref >=40)
LDL CALC: 93 mg/dL (ref <130)
TRIGLYCERIDES: 140 mg/dL (ref <150)
Total CHOL/HDL Ratio: 3.2 Ratio (ref <=5.0)
VLDL: 28 mg/dL (ref <30)

## 2016-06-05 LAB — PSA: PSA: 0.6 ng/mL (ref <=4.0)

## 2016-06-05 MED ORDER — ALPRAZOLAM 1 MG PO TABS: 0.5000 mg | ORAL_TABLET | Freq: Every evening | ORAL | 0 refills | Status: DC | PRN

## 2016-06-05 MED ORDER — LISINOPRIL 20 MG PO TABS: 20.0000 mg | ORAL_TABLET | Freq: Every day | ORAL | 3 refills | Status: DC

## 2016-06-05 MED ORDER — VALACYCLOVIR HCL 1 G PO TABS: ORAL_TABLET | ORAL | 0 refills | Status: DC

## 2016-06-05 MED ORDER — MONTELUKAST SODIUM 10 MG PO TABS: 10.0000 mg | ORAL_TABLET | Freq: Every day | ORAL | 3 refills | Status: DC

## 2016-06-05 NOTE — Patient Instructions (Signed)
HEALTH MAINTENANCE RECOMMENDATIONS:  It is recommended that you get at least 30 minutes of aerobic exercise at least 5 days/week (for weight loss, you may need as much as 60-90 minutes). This can be any activity that gets your heart rate up. This can be divided in 10-15 minute intervals if needed, but try and build up your endurance at least once a week.  Weight bearing exercise is also recommended twice weekly.  Eat a healthy diet with lots of vegetables, fruits and fiber.  "Colorful" foods have a lot of vitamins (ie green vegetables, tomatoes, red peppers, etc).  Limit sweet tea, regular sodas and alcoholic beverages, all of which has a lot of calories and sugar.  Up to 2 alcoholic drinks daily may be beneficial for men (unless trying to lose weight, watch sugars).  Drink a lot of water.  Sunscreen of at least SPF 30 should be used on all sun-exposed parts of the skin when outside between the hours of 10 am and 4 pm (not just when at beach or pool, but even with exercise, golf, tennis, and yard work!)  Use a sunscreen that says "broad spectrum" so it covers both UVA and UVB rays, and make sure to reapply every 1-2 hours.  Remember to change the batteries in your smoke detectors when changing your clock times in the spring and fall.  Use your seat belt every time you are in a car, and please drive safely and not be distracted with cell phones and texting while driving.    Your blood pressure was elevated today.  Goal BP is <130/80.  Please check more often at home, and if persistently over 140, please let us know, as I will adjust your medication dose.  Cutting back on the sodium in your diet and losing weight will also help get your blood pressure lower.   Low-Sodium Eating Plan Sodium raises blood pressure and causes water to be held in the body. Getting less sodium from food will help lower your blood pressure, reduce any swelling, and protect your heart, liver, and kidneys. We get sodium by  adding salt (sodium chloride) to food. Most of our sodium comes from canned, boxed, and frozen foods. Restaurant foods, fast foods, and pizza are also very high in sodium. Even if you take medicine to lower your blood pressure or to reduce fluid in your body, getting less sodium from your food is important. WHAT IS MY PLAN? Most people should limit their sodium intake to 2,300 mg a day. Your health care provider recommends that you limit your sodium intake to __________ a day.  WHAT DO I NEED TO KNOW ABOUT THIS EATING PLAN? For the low-sodium eating plan, you will follow these general guidelines:  Choose foods with a % Daily Value for sodium of less than 5% (as listed on the food label).   Use salt-free seasonings or herbs instead of table salt or sea salt.   Check with your health care provider or pharmacist before using salt substitutes.   Eat fresh foods.  Eat more vegetables and fruits.  Limit canned vegetables. If you do use them, rinse them well to decrease the sodium.   Limit cheese to 1 oz (28 g) per day.   Eat lower-sodium products, often labeled as "lower sodium" or "no salt added."  Avoid foods that contain monosodium glutamate (MSG). MSG is sometimes added to [place] food and some canned foods.  Check food labels (Nutrition Facts labels) on foods to learn how much  sodium is in one serving.  Eat more home-cooked food and less restaurant, buffet, and fast food.  When eating at a restaurant, ask that your food be prepared with less salt, or no salt if possible.  HOW DO I READ FOOD LABELS FOR SODIUM INFORMATION? The Nutrition Facts label lists the amount of sodium in one serving of the food. If you eat more than one serving, you must multiply the listed amount of sodium by the number of servings. Food labels may also identify foods as:  Sodium free--Less than 5 mg in a serving.  Very low sodium--35 mg or less in a serving.  Low sodium--140 mg or less in a  serving.  Light in sodium--50% less sodium in a serving. For example, if a food that usually has 300 mg of sodium is changed to become light in sodium, it will have 150 mg of sodium.  Reduced sodium--25% less sodium in a serving. For example, if a food that usually has 400 mg of sodium is changed to reduced sodium, it will have 300 mg of sodium. WHAT FOODS CAN I EAT? Grains Low-sodium cereals, including oats, puffed wheat and rice, and shredded wheat cereals. Low-sodium crackers. Unsalted rice and pasta. Lower-sodium bread.  Vegetables Frozen or fresh vegetables. Low-sodium or reduced-sodium canned vegetables. Low-sodium or reduced-sodium tomato sauce and paste. Low-sodium or reduced-sodium tomato and vegetable juices.  Fruits Fresh, frozen, and canned fruit. Fruit juice.  Meat and Other Protein Products Low-sodium canned tuna and salmon. Fresh or frozen meat, poultry, seafood, and fish. Lamb. Unsalted nuts. Dried beans, peas, and lentils without added salt. Unsalted canned beans. Homemade soups without salt. Eggs.  Dairy Milk. Soy milk. Ricotta cheese. Low-sodium or reduced-sodium cheeses. Yogurt.  Condiments Fresh and dried herbs and spices. Salt-free seasonings. Onion and garlic powders. Low-sodium varieties of mustard and ketchup. Fresh or refrigerated horseradish. Lemon juice.  Fats and Oils Reduced-sodium salad dressings. Unsalted butter.  Other Unsalted popcorn and pretzels.  The items listed above may not be a complete list of recommended foods or beverages. Contact your dietitian for more options. WHAT FOODS ARE NOT RECOMMENDED? Grains Instant hot cereals. Bread stuffing, pancake, and biscuit mixes. Croutons. Seasoned rice or pasta mixes. Noodle soup cups. Boxed or frozen macaroni and cheese. Self-rising flour. Regular salted crackers. Vegetables Regular canned vegetables. Regular canned tomato sauce and paste. Regular tomato and vegetable juices. Frozen vegetables in  sauces. Salted [place] fries. Olives. Rosita Fire. Relishes. Sauerkraut. Salsa. Meat and Other Protein Products Salted, canned, smoked, spiced, or pickled meats, seafood, or fish. Bacon, ham, sausage, hot dogs, corned beef, chipped beef, and packaged luncheon meats. Salt pork. Jerky. Pickled herring. Anchovies, regular canned tuna, and sardines. Salted nuts. Dairy Processed cheese and cheese spreads. Cheese curds. Blue cheese and cottage cheese. Buttermilk.  Condiments Onion and garlic salt, seasoned salt, table salt, and sea salt. Canned and packaged gravies. Worcestershire sauce. Tartar sauce. Barbecue sauce. Teriyaki sauce. Soy sauce, including reduced sodium. Steak sauce. Fish sauce. Oyster sauce. Cocktail sauce. Horseradish that you find on the shelf. Regular ketchup and mustard. Meat flavorings and tenderizers. Bouillon cubes. Hot sauce. Tabasco sauce. Marinades. Taco seasonings. Relishes. Fats and Oils Regular salad dressings. Salted butter. Margarine. Ghee. Bacon fat.  Other Potato and tortilla chips. Corn chips and puffs. Salted popcorn and pretzels. Canned or dried soups. Pizza. Frozen entrees and pot pies.  The items listed above may not be a complete list of foods and beverages to avoid. Contact your dietitian for more information.  This information is not intended to replace advice given to you by your health care provider. Make sure you discuss any questions you have with your health care provider.   Document Released: 03/08/2002 Document Revised: 10/07/2014 Document Reviewed: 07/21/2013 Elsevier Interactive Patient Education [employer].

## 2016-06-06 LAB — HEPATITIS C ANTIBODY: HCV AB: NEGATIVE

## 2016-08-17 ENCOUNTER — Other Ambulatory Visit: Payer: Self-pay | Admitting: Family Medicine

## 2016-08-17 DIAGNOSIS — I10 Essential (primary) hypertension: Secondary | ICD-10-CM

## 2016-08-19 ENCOUNTER — Encounter: Payer: Self-pay | Admitting: Family Medicine

## 2016-08-19 ENCOUNTER — Ambulatory Visit (INDEPENDENT_AMBULATORY_CARE_PROVIDER_SITE_OTHER): Payer: BLUE CROSS/BLUE SHIELD | Admitting: Family Medicine

## 2016-08-19 VITALS — BP 132/86 | HR 76 | Temp 103.9°F | Ht 70.0 in | Wt 206.2 lb

## 2016-08-19 DIAGNOSIS — J101 Influenza due to other identified influenza virus with other respiratory manifestations: Secondary | ICD-10-CM | POA: Diagnosis not present

## 2016-08-19 DIAGNOSIS — R509 Fever, unspecified: Secondary | ICD-10-CM

## 2016-08-19 LAB — POC INFLUENZA A&B (BINAX/QUICKVUE)
Influenza A, POC: POSITIVE — AB
Influenza B, POC: NEGATIVE

## 2016-08-19 MED ORDER — OSELTAMIVIR PHOSPHATE 75 MG PO CAPS: 75.0000 mg | ORAL_CAPSULE | Freq: Two times a day (BID) | ORAL | 0 refills | Status: DC

## 2016-08-19 NOTE — Progress Notes (Signed)
Chief Complaint  Patient presents with  . Cough    started Thursday with congestion. Fever yesterday. Coughing and body aches. Mucus is green, ear are full. Thinks he has flu and would like to be checked.    4 days ago he noted slight congestion.  Congestion increased, developed cough 2 nights ago.  Fever started yesterday. Cough is mostly dry, gets up small amount of discolored phlegm periodically.  +myalgias in legs, chest, arms.  He is taking Mucinex DM which is helping significantly with his cough. He has been taking tylenol--none yet this morning.  He got a flu shot in September.  No known sick contacts; recent airplane flight.  PMH, PSH, SH reviewed  Outpatient Encounter Prescriptions as of 08/19/2016  Medication Sig  . ALPRAZolam (XANAX) 1 MG tablet Take 0.5-1 tablets (0.5-1 mg total) by mouth at bedtime as needed for sleep.  Marland Kitchen dextromethorphan-guaiFENesin (MUCINEX DM) 30-600 MG 12hr tablet Take 1 tablet by mouth 2 (two) times daily.  Marland Kitchen lisinopril (PRINIVIL,ZESTRIL) 20 MG tablet Take 1 tablet (20 mg total) by mouth daily.  Marland Kitchen loratadine (CLARITIN) 10 MG tablet Take 10 mg by mouth daily.  . montelukast (SINGULAIR) 10 MG tablet Take 1 tablet (10 mg total) by mouth at bedtime.  . triamcinolone (NASACORT AQ) 55 MCG/ACT AERO nasal inhaler Place 2 sprays into the nose daily.  . [DISCONTINUED] cetirizine (ZYRTEC) 10 MG tablet Take 10 mg by mouth daily.  Marland Kitchen oseltamivir (TAMIFLU) 75 MG capsule Take 1 capsule (75 mg total) by mouth 2 (two) times daily.  . valACYclovir (VALTREX) 1000 MG tablet TAKE 2 TABLETS AT ONSET OF COLD SORE. REPEAT ONCE IN 12 HOURS (TOTAL OF 4 TABLETS PER COURSE) (Patient not taking: Reported on 08/19/2016)   No facility-administered encounter medications on file as of 08/19/2016.    (tamiflu rx'd today, not prior to visit)  No Known Allergies  ROS:  No nausea, vomiting, diarrhea, bleeding, bruising, rash, shortness of breath, chest pain, palpitations.  See  HPI  PHYSICAL EXAM:  BP 132/86 (BP Location: Left Arm, Patient Position: Sitting, Cuff Size: Normal)   Pulse 76   Temp (!) 103.9 F (39.9 C) (Tympanic)   Ht _0  (1.778 m)   Wt 206 lb 3.2 oz (93.5 kg)   BMI 29.59 kg/m   Well appearing male, in no distress. No coughing during visit HEENT: PERRL, EOMI, conjunctiva and sclera are clear. TM's and EAC's normal. Nasal mucosa appears normal; sinuses nontender. OP is clear Neck: no lymphadenopathy or mass Heart: regular rate and rhythm without murmur Lungs: clear bilaterally Skin: normal turgor, no rash Neuro: alert and oriented, cranial nerves intact, normal strength, gait Psych: normal mood, affect, hygiene and grooming   Influenza A positive  ASSESSMENT/PLAN:  Influenza A - Plan: oseltamivir (TAMIFLU) 75 MG capsule  Fever and chills - Plan: POC Influenza A&B (Binax test)   Supportive measures reviewed. Risks/side effects of tamiflu reviewed. F/u if persistent/worsening symptoms; normal course reviewed.   Drink plenty of fluids. Continue Mucinex DM as needed for cough. Use sinus rinse kit or Neti-pot as needed for sinus congestion/pain. Continue to use tylenol and/or ibuprofen as needed for fever, headache, body aches.  Do not use Dayquil/Nyquil or Delsym.  Continue the Mucinex DM You can use Coricidin HBP--look at the ingredients to ensure it doesn't have dextromethorphan (DM) if needed for congestion/runny nose. If needed, you can take benadryl at night (so you can sleep).

## 2016-08-19 NOTE — Patient Instructions (Signed)
Drink plenty of fluids. Continue Mucinex DM as needed for cough. Use sinus rinse kit or Neti-pot as needed for sinus congestion/pain. Continue to use tylenol and/or ibuprofen as needed for fever, headache, body aches.  Do not use Dayquil/Nyquil or Delsym.  Continue the Mucinex DM You can use Coricidin HBP--look at the ingredients to ensure it doesn't have dextromethorphan (DM) if needed for congestion/runny nose. If needed, you can take benadryl at night (so you can sleep).   Influenza, Adult Influenza, more commonly known as "the flu," is a viral infection that primarily affects the respiratory tract. The respiratory tract includes organs that help you breathe, such as the lungs, nose, and throat. The flu causes many common cold symptoms, as well as a high fever and body aches. The flu spreads easily from person to person (is contagious). Getting a flu shot (influenza vaccination) every year is the best way to prevent influenza. What are the causes? Influenza is caused by a virus. You can catch the virus by:  Breathing in droplets from an infected person's cough or sneeze.  Touching something that was recently contaminated with the virus and then touching your mouth, nose, or eyes. What increases the risk? The following factors may make you more likely to get the flu:  Not cleaning your hands frequently with soap and water or alcohol-based hand sanitizer.  Having close contact with many people during cold and flu season.  Touching your mouth, eyes, or nose without washing or sanitizing your hands first.  Not drinking enough fluids or not eating a healthy diet.  Not getting enough sleep or exercise.  Being under a high amount of stress.  Not getting a yearly (annual) flu shot. You may be at a higher risk of complications from the flu, such as a severe lung infection (pneumonia), if you:  Are over the age of 42.  Are pregnant.  Have a weakened disease-fighting system (immune  system). You may have a weakened immune system if you:  Have HIV or AIDS.  Are undergoing chemotherapy.  Aretaking medicines that reduce the activity of (suppress) the immune system.  Have a long-term (chronic) illness, such as heart disease, kidney disease, diabetes, or lung disease.  Have a liver disorder.  Are obese.  Have anemia. What are the signs or symptoms? Symptoms of this condition typically last 4-10 days and may include:  Fever.  Chills.  Headache, body aches, or muscle aches.  Sore throat.  Cough.  Runny or congested nose.  Chest discomfort and cough.  Poor appetite.  Weakness or tiredness (fatigue).  Dizziness.  Nausea or vomiting. How is this diagnosed? This condition may be diagnosed based on your medical history and a physical exam. Your health care provider may do a nose or throat swab test to confirm the diagnosis. How is this treated? If influenza is detected early, you can be treated with antiviral medicine that can reduce the length of your illness and the severity of your symptoms. This medicine may be given by mouth (orally) or through an IV tube that is inserted in one of your veins. The goal of treatment is to relieve symptoms by taking care of yourself at home. This may include taking over-the-counter medicines, drinking plenty of fluids, and adding humidity to the air in your home. In some cases, influenza goes away on its own. Severe influenza or complications from influenza may be treated in a hospital. Follow these instructions at home:  Take over-the-counter and prescription medicines only as told  by your health care provider.  Use a cool mist humidifier to add humidity to the air in your home. This can make breathing easier.  Rest as needed.  Drink enough fluid to keep your urine clear or pale yellow.  Cover your mouth and nose when you cough or sneeze.  Wash your hands with soap and water often, especially after you cough or  sneeze. If soap and water are not available, use hand sanitizer.  Stay home from work or school as told by your health care provider. Unless you are visiting your health care provider, try to avoid leaving home until your fever has been gone for 24 hours without the use of medicine.  Keep all follow-up visits as told by your health care provider. This is important. How is this prevented?  Getting an annual flu shot is the best way to avoid getting the flu. You may get the flu shot in late summer, fall, or winter. Ask your health care provider when you should get your flu shot.  Wash your hands often or use hand sanitizer often.  Avoid contact with people who are sick during cold and flu season.  Eat a healthy diet, drink plenty of fluids, get enough sleep, and exercise regularly. Contact a health care provider if:  You develop new symptoms.  You have:  Chest pain.  Diarrhea.  A fever.  Your cough gets worse.  You produce more mucus.  You feel nauseous or you vomit. Get help right away if:  You develop shortness of breath or difficulty breathing.  Your skin or nails turn a bluish color.  You have severe pain or stiffness in your neck.  You develop a sudden headache or sudden pain in your face or ear.  You cannot stop vomiting. This information is not intended to replace advice given to you by your health care provider. Make sure you discuss any questions you have with your health care provider. Document Released: 09/13/2000 Document Revised: 02/22/2016 Document Reviewed: 07/11/2015 Elsevier Interactive Patient Education  2017 [employer].

## 2016-08-24 ENCOUNTER — Other Ambulatory Visit: Payer: Self-pay | Admitting: Family Medicine

## 2016-08-24 MED ORDER — AMOXICILLIN-POT CLAVULANATE 875-125 MG PO TABS: 1.0000 | ORAL_TABLET | Freq: Two times a day (BID) | ORAL | 0 refills | Status: DC

## 2016-08-24 NOTE — Progress Notes (Signed)
He was recently diagnosed with the flu and is getting more nasal congestion and PND. Probable sinusitis and will tx with Augmentin

## 2016-09-03 ENCOUNTER — Telehealth: Payer: Self-pay | Admitting: Family Medicine

## 2016-09-03 ENCOUNTER — Other Ambulatory Visit: Payer: Self-pay | Admitting: Family Medicine

## 2016-09-03 NOTE — Telephone Encounter (Signed)
Go ahead and refill it

## 2016-09-03 NOTE — Telephone Encounter (Signed)
I have approved 

## 2016-09-03 NOTE — Telephone Encounter (Signed)
Is this okay to refill again

## 2016-09-03 NOTE — Telephone Encounter (Signed)
Finding out how he is doing

## 2016-09-03 NOTE — Telephone Encounter (Signed)
Pt called stating that he has finished antibiotic but he still has head congestion, occasional cough. He is not completely over the upper respiratory symptom. Pt would like another round of antibiotic

## 2016-12-02 ENCOUNTER — Encounter: Payer: BLUE CROSS/BLUE SHIELD | Admitting: Family Medicine

## 2016-12-17 NOTE — Progress Notes (Signed)
Chief Complaint  Patient presents with  . Hypertension    nonfasting annual med check. Needs refill on valtrex.    Hypertension follow-up: He reports that his blood pressures have been 135-148/79-88. Denies dizziness, headaches, chest pain. Denies side effects of medications. Eats a lot of salads, some of which have ham.  No longer having the feta. Eats some olives, lightly salted nuts. Has a sip of pickle juice when he works out (along with some vinegar). Had country ham for breakfast today. He continues to exercise regularly, but hasn't lost any weight (some gain). Some business travel recently.  Impaired fasting glucose--glucose was noted to be slighty elevated at 104 on his September physical. He limits carbs, exercises regularly.  Allergies: He has been taking Zyrtec, montelukast and Nasacort fairly regularly, with good results. He reports that Nasacort is less expensive OTC than Rx. He has some questions about whether it is okay to miss a pill (montelukast, if mis-counts for travel).  Herpes Labialis: Infrequent flare; still has medication (6 left), requesting refill. Flares are usually related to travel, sun, stress. Recent flare on a golf trip.  Anxiety--is much improved. He now uses 1/2 tablet intermittently to help with sleep, usually related to travel.Last filled #30 on 06/05/16. Doesn't need a refill at this time.  He also uses benadryl prn for insomnia, which helps.  PMH, PSH, SH reviewed  Outpatient Encounter Prescriptions as of 12/18/2016  Medication Sig  . cetirizine (ZYRTEC) 10 MG tablet Take 10 mg by mouth daily.  . montelukast (SINGULAIR) 10 MG tablet Take 1 tablet (10 mg total) by mouth at bedtime.  . triamcinolone (NASACORT AQ) 55 MCG/ACT AERO nasal inhaler Place 2 sprays into the nose daily.  . [DISCONTINUED] lisinopril (PRINIVIL,ZESTRIL) 20 MG tablet TAKE 1 TABLET (20 MG TOTAL) BY MOUTH DAILY.  Marland Kitchen. ALPRAZolam (XANAX) 1 MG tablet Take 0.5-1 tablets (0.5-1 mg total) by  mouth at bedtime as needed for sleep. (Patient not taking: Reported on 12/18/2016)  . lisinopril-hydrochlorothiazide (ZESTORETIC) 20-12.5 MG tablet Take 1 tablet by mouth daily.  . valACYclovir (VALTREX) 1000 MG tablet TAKE 2 TABLETS AT ONSET OF COLD SORE. REPEAT ONCE IN 12 HOURS (TOTAL OF 4 TABLETS PER COURSE)  . [DISCONTINUED] amoxicillin-clavulanate (AUGMENTIN) 875-125 MG tablet TAKE 1 TABLET BY MOUTH 2 (TWO) TIMES DAILY.  . [DISCONTINUED] dextromethorphan-guaiFENesin (MUCINEX DM) 30-600 MG 12hr tablet Take 1 tablet by mouth 2 (two) times daily.  . [DISCONTINUED] lisinopril (PRINIVIL,ZESTRIL) 20 MG tablet Take 1 tablet (20 mg total) by mouth daily.  . [DISCONTINUED] loratadine (CLARITIN) 10 MG tablet Take 10 mg by mouth daily.  . [DISCONTINUED] oseltamivir (TAMIFLU) 75 MG capsule Take 1 capsule (75 mg total) by mouth 2 (two) times daily.  . [DISCONTINUED] valACYclovir (VALTREX) 1000 MG tablet TAKE 2 TABLETS AT ONSET OF COLD SORE. REPEAT ONCE IN 12 HOURS (TOTAL OF 4 TABLETS PER COURSE) (Patient not taking: Reported on 08/19/2016)   No facility-administered encounter medications on file as of 12/18/2016.    No Known Allergies  ROS: no fever, chills, URI symptoms. Allergies controlled.  Denies headaches, dizziness, chest pain, palpitations, shortness of breath, edema, GI or GU complaints, bleeding, bruising, rash or joint pains.  Moods are good, intermittent insomnia, controlled.   PHYSICAL EXAM:  BP (!) 150/90 (BP Location: Left Arm, Patient Position: Sitting, Cuff Size: Normal)   Pulse 72   Ht 5\' 10"  (1.778 m)   Wt 208 lb 3.2 oz (94.4 kg)   BMI 29.87 kg/m   Wt Readings from Last  3 Encounters:  12/18/16 208 lb 3.2 oz (94.4 kg)  08/19/16 206 lb 3.2 oz (93.5 kg)  06/05/16 204 lb 3.2 oz (92.6 kg)   144/92 on repeat by MD  Well developed, pleasant male, overweight, in good spirits HEENT: PERRL, EOMI, conjunctiva and sclera are clear. OP clear Neck: no lymphadenopathy, thyromegaly or  mass, no bruit. Heart: regular rate and rhythm without murmur Lungs: clear bilaterally Abdomen: soft, nontender, no organomegaly or mass Extremities: no edema, normal pulses Skin: normal turgor, no rash Psych: normal mood, affect, hygiene and grooming Neuro: alert and oriented, cranial nerves intact, normal gait  A1c 5.4%   ASSESSMENT/PLAN:    Essential hypertension, benign - above goal; reviewed low sodium diet, weight loss and goal BP. Change from lisinopril to lisinopril HCT.  Risks/side effects reviewed.  f/u 4-6 wks - Plan: lisinopril-hydrochlorothiazide (ZESTORETIC) 20-12.5 MG tablet  Impaired fasting glucose - normal A1c today. Continue lowfat, low carb diet; weight loss encouraged - Plan: HgB A1c  Herpes labialis - infrequent flare. Continue Valtrex prn - Plan: valACYclovir (VALTREX) 1000 MG tablet

## 2016-12-18 ENCOUNTER — Ambulatory Visit (INDEPENDENT_AMBULATORY_CARE_PROVIDER_SITE_OTHER): Payer: BLUE CROSS/BLUE SHIELD | Admitting: Family Medicine

## 2016-12-18 ENCOUNTER — Other Ambulatory Visit: Payer: Self-pay | Admitting: Family Medicine

## 2016-12-18 ENCOUNTER — Encounter: Payer: Self-pay | Admitting: Family Medicine

## 2016-12-18 VITALS — BP 150/90 | HR 72 | Ht 70.0 in | Wt 208.2 lb

## 2016-12-18 DIAGNOSIS — I10 Essential (primary) hypertension: Secondary | ICD-10-CM | POA: Diagnosis not present

## 2016-12-18 DIAGNOSIS — B001 Herpesviral vesicular dermatitis: Secondary | ICD-10-CM | POA: Diagnosis not present

## 2016-12-18 DIAGNOSIS — R7301 Impaired fasting glucose: Secondary | ICD-10-CM | POA: Diagnosis not present

## 2016-12-18 LAB — POCT GLYCOSYLATED HEMOGLOBIN (HGB A1C): HEMOGLOBIN A1C: 5.4

## 2016-12-18 MED ORDER — VALACYCLOVIR HCL 1 G PO TABS: ORAL_TABLET | ORAL | 0 refills | Status: DC

## 2016-12-18 MED ORDER — LISINOPRIL-HYDROCHLOROTHIAZIDE 20-12.5 MG PO TABS: 1.0000 | ORAL_TABLET | Freq: Every day | ORAL | 0 refills | Status: DC

## 2016-12-18 NOTE — Telephone Encounter (Signed)
He has a med check scheduled for today.  I'll do at his visit

## 2016-12-18 NOTE — Patient Instructions (Addendum)
Cut back on the salt in the diet--avoid salted nuts, pickles (and juice), ham and other processed meats.  Continue regular exercise. Weight loss is encouraged.  We are changing your blood pressure pill to lisinopril HCT 20-12.5.  Take this once daily in the morning.  Be sure to continue to get some bananas in your diet.  Be sure to stay well hydrated.  Check your blood pressure regularly, record and bring the list to your next visit.   DASH Eating Plan DASH stands for "Dietary Approaches to Stop Hypertension." The DASH eating plan is a healthy eating plan that has been shown to reduce high blood pressure (hypertension). It may also reduce your risk for type 2 diabetes, heart disease, and stroke. The DASH eating plan may also help with weight loss. What are tips for following this plan? General guidelines   Avoid eating more than 2,300 mg (milligrams) of salt (sodium) a day. If you have hypertension, you may need to reduce your sodium intake to 1,500 mg a day.  Limit alcohol intake to no more than 1 drink a day for nonpregnant women and 2 drinks a day for men. One drink equals 12 oz of beer, 5 oz of wine, or 1 oz of hard liquor.  Work with your health care provider to maintain a healthy body weight or to lose weight. Ask what an ideal weight is for you.  Get at least 30 minutes of exercise that causes your heart to beat faster (aerobic exercise) most days of the week. Activities may include walking, swimming, or biking.  Work with your health care provider or diet and nutrition specialist (dietitian) to adjust your eating plan to your individual calorie needs. Reading food labels   Check food labels for the amount of sodium per serving. Choose foods with less than 5 percent of the Daily Value of sodium. Generally, foods with less than 300 mg of sodium per serving fit into this eating plan.  To find whole grains, look for the word "whole" as the first word in the ingredient list. Shopping    Buy products labeled as "low-sodium" or "no salt added."  Buy fresh foods. Avoid canned foods and premade or frozen meals. Cooking   Avoid adding salt when cooking. Use salt-free seasonings or herbs instead of table salt or sea salt. Check with your health care provider or pharmacist before using salt substitutes.  Do not fry foods. Cook foods using healthy methods such as baking, boiling, grilling, and broiling instead.  Cook with heart-healthy oils, such as olive, canola, soybean, or sunflower oil. Meal planning    Eat a balanced diet that includes:  5 or more servings of fruits and vegetables each day. At each meal, try to fill half of your plate with fruits and vegetables.  Up to 6-8 servings of whole grains each day.  Less than 6 oz of lean meat, poultry, or fish each day. A 3-oz serving of meat is about the same size as a deck of cards. One egg equals 1 oz.  2 servings of low-fat dairy each day.  A serving of nuts, seeds, or beans 5 times each week.  Heart-healthy fats. Healthy fats called Omega-3 fatty acids are found in foods such as flaxseeds and coldwater fish, like sardines, salmon, and mackerel.  Limit how much you eat of the following:  Canned or prepackaged foods.  Food that is high in trans fat, such as fried foods.  Food that is high in saturated fat, such  as fatty meat.  Sweets, desserts, sugary drinks, and other foods with added sugar.  Full-fat dairy products.  Do not salt foods before eating.  Try to eat at least 2 vegetarian meals each week.  Eat more home-cooked food and less restaurant, buffet, and fast food.  When eating at a restaurant, ask that your food be prepared with less salt or no salt, if possible. What foods are recommended? The items listed may not be a complete list. Talk with your dietitian about what dietary choices are best for you. Grains  Whole-grain or whole-wheat bread. Whole-grain or whole-wheat pasta. Brown rice.  Orpah Cobbatmeal. Quinoa. Bulgur. Whole-grain and low-sodium cereals. Pita bread. Low-fat, low-sodium crackers. Whole-wheat flour tortillas. Vegetables  Fresh or frozen vegetables (raw, steamed, roasted, or grilled). Low-sodium or reduced-sodium tomato and vegetable juice. Low-sodium or reduced-sodium tomato sauce and tomato paste. Low-sodium or reduced-sodium canned vegetables. Fruits  All fresh, dried, or frozen fruit. Canned fruit in natural juice (without added sugar). Meat and other protein foods  Skinless chicken or Mal[place]urkey. Ground chicken or [place]urkey. Pork with fat trimmed off. Fish and seafood. Egg whites. Dried beans, peas, or lentils. Unsalted nuts, nut butters, and seeds. Unsalted canned beans. Lean cuts of beef with fat trimmed off. Low-sodium, lean deli meat. Dairy  Low-fat (1%) or fat-free (skim) milk. Fat-free, low-fat, or reduced-fat cheeses. Nonfat, low-sodium ricotta or cottage cheese. Low-fat or nonfat yogurt. Low-fat, low-sodium cheese. Fats and oils  Soft margarine without trans fats. Vegetable oil. Low-fat, reduced-fat, or light mayonnaise and salad dressings (reduced-sodium). Canola, safflower, olive, soybean, and sunflower oils. Avocado. Seasoning and other foods  Herbs. Spices. Seasoning mixes without salt. Unsalted popcorn and pretzels. Fat-free sweets. What foods are not recommended? The items listed may not be a complete list. Talk with your dietitian about what dietary choices are best for you. Grains  Baked goods made with fat, such as croissants, muffins, or some breads. Dry pasta or rice meal packs. Vegetables  Creamed or fried vegetables. Vegetables in a cheese sauce. Regular canned vegetables (not low-sodium or reduced-sodium). Regular canned tomato sauce and paste (not low-sodium or reduced-sodium). Regular tomato and vegetable juice (not low-sodium or reduced-sodium). Rosita FirePickles. Olives. Fruits  Canned fruit in a light or heavy syrup. Fried fruit. Fruit in cream or butter  sauce. Meat and other protein foods  Fatty cuts of meat. Ribs. Fried meat. Tomasa BlaseBacon. Sausage. Bologna and other processed lunch meats. Salami. Fatback. Hotdogs. Bratwurst. Salted nuts and seeds. Canned beans with added salt. Canned or smoked fish. Whole eggs or egg yolks. Chicken or [place]urkey with skin. Dairy  Whole or 2% milk, cream, and half-and-half. Whole or full-fat cream cheese. Whole-fat or sweetened yogurt. Full-fat cheese. Nondairy creamers. Whipped toppings. Processed cheese and cheese spreads. Fats and oils  Butter. Stick margarine. Lard. Shortening. Ghee. Bacon fat. Tropical oils, such as coconut, palm kernel, or palm oil. Seasoning and other foods  Salted popcorn and pretzels. Onion salt, garlic salt, seasoned salt, table salt, and sea salt. Worcestershire sauce. Tartar sauce. Barbecue sauce. Teriyaki sauce. Soy sauce, including reduced-sodium. Steak sauce. Canned and packaged gravies. Fish sauce. Oyster sauce. Cocktail sauce. Horseradish that you find on the shelf. Ketchup. Mustard. Meat flavorings and tenderizers. Bouillon cubes. Hot sauce and Tabasco sauce. Premade or packaged marinades. Premade or packaged taco seasonings. Relishes. Regular salad dressings. Where to find more information:  National Heart, Lung, and Blood Institute: PopSteam.iswww.nhlbi.nih.gov  American Heart Association: www.heart.org Summary  The DASH eating plan is a healthy eating plan that has  been shown to reduce high blood pressure (hypertension). It may also reduce your risk for type 2 diabetes, heart disease, and stroke.  With the DASH eating plan, you should limit salt (sodium) intake to 2,300 mg a day. If you have hypertension, you may need to reduce your sodium intake to 1,500 mg a day.  When on the DASH eating plan, aim to eat more fresh fruits and vegetables, whole grains, lean proteins, low-fat dairy, and heart-healthy fats.  Work with your health care provider or diet and nutrition specialist (dietitian) to  adjust your eating plan to your individual calorie needs. This information is not intended to replace advice given to you by your health care provider. Make sure you discuss any questions you have with your health care provider. Document Released: 09/05/2011 Document Revised: 09/09/2016 Document Reviewed: 09/09/2016 Elsevier Interactive Patient Education  2017 [employer].

## 2016-12-18 NOTE — Telephone Encounter (Signed)
Is this okay to refill? 

## 2017-01-19 NOTE — Progress Notes (Signed)
Chief Complaint  Patient presents with  . Hypertension    4 week follow up on hypertension.    Hypertension follow-up: He was changed from lisinopril  to lisinopril HCT 20-12.5mg  at his last visit a month ago.  He reports that his blood pressures have been 135/75-82,only checked a few times.. Denies dizziness, headaches, chest pain. Denies side effects of medications--just slight increase in urination, tolerable (up once a night, not every night), no muscle cramps. Eating bananas. We discussed low sodium diet at his last visit, and he cut back on ham, pickle juice. He notices it is easier to get his ring on/off.  He continues to exercise regularly--golf, walking, gym.  Allergies flaring, taking zyrtec and singulair, but admits to more sporadic use of the nasacort.  He is having more tickle in his throat since being out in the yard this weekend.  PMH, PSH, SH reviewed  Outpatient Encounter Prescriptions as of 01/20/2017  Medication Sig  . ALPRAZolam (XANAX) 1 MG tablet Take 0.5-1 tablets (0.5-1 mg total) by mouth at bedtime as needed for sleep.  . cetirizine (ZYRTEC) 10 MG tablet Take 10 mg by mouth daily.  Marland Kitchen lisinopril-hydrochlorothiazide (ZESTORETIC) 20-12.5 MG tablet Take 1 tablet by mouth daily.  . montelukast (SINGULAIR) 10 MG tablet Take 1 tablet (10 mg total) by mouth at bedtime.  . triamcinolone (NASACORT AQ) 55 MCG/ACT AERO nasal inhaler Place 2 sprays into the nose daily.  . valACYclovir (VALTREX) 1000 MG tablet TAKE 2 TABLETS AT ONSET OF COLD SORE. REPEAT ONCE IN 12 HOURS (TOTAL OF 4 TABLETS PER COURSE) (Patient not taking: Reported on 01/20/2017)  . [DISCONTINUED] lisinopril (PRINIVIL,ZESTRIL) 20 MG tablet Take 20 mg by mouth daily.   No facility-administered encounter medications on file as of 01/20/2017.    No Known Allergies  ROS:  No fever, chills, headaches, dizziness, chest pain, shortness of breath, nausea, vomiting, bowel changes. No bleeding, bruising, rash, joint  pain, mood changes or other concerns except as noted in HPI   PHYSICAL EXAM:  BP 138/82 (BP Location: Left Arm, Patient Position: Sitting, Cuff Size: Normal)   Pulse 76   Ht  (1.778 m)   Wt 208 lb (94.3 kg)   BMI 29.84 kg/m   144/88 on repeat by MD  Wt Readings from Last 3 Encounters:  01/20/17 208 lb (94.3 kg)  12/18/16 208 lb 3.2 oz (94.4 kg)  08/19/16 206 lb 3.2 oz (93.5 kg)    Well developed, pleasant male, overweight, in good spirits HEENT: conjunctiva and sclera are clear. OP clear. Nasal mucosa mildly edematous, OP clear. Neck: no lymphadenopathy, thyromegaly or mass, no bruit. Heart: regular rate and rhythm without murmur Lungs: clear bilaterally Extremities: no edema, normal pulses Skin: normal turgor, no rash Psych: normal mood, affect, hygiene and grooming Neuro: alert and oriented, cranial nerves intact, normal gait   ASSESSMENT/PLAN:  Essential hypertension, benign - improved, remains bordereline. Continue low sodium diet, encouraged weight loss. Continue same meds; monitor at home and send BP's within 1-2 mos - Plan: Basic metabolic panel  Medication monitoring encounter - Plan: Basic metabolic panel  Allergic rhinitis, unspecified seasonality, unspecified trigger - continue zyrtec, singulair. Encouraged daily use of nasacort. Use mucinex prn PND/cough    BP improved, but still borderline. Continue current medication. Continue low sodium diet. Check blood pressure at  Least once a week and in a month or two send [place] the values through MyChart (sooner if they are getting too high or if you have concerns). Hoping  to see the numbers drop with time, as you remain active and are able to lose some inches at your waist.  Continue the zyrtec daily. Use mucinex as needed for any thick postnasal drip that causes cough or chest congestion. Use the Nasacort EVERY DAY.

## 2017-01-20 ENCOUNTER — Encounter: Payer: Self-pay | Admitting: Family Medicine

## 2017-01-20 ENCOUNTER — Ambulatory Visit (INDEPENDENT_AMBULATORY_CARE_PROVIDER_SITE_OTHER): Payer: BLUE CROSS/BLUE SHIELD | Admitting: Family Medicine

## 2017-01-20 VITALS — BP 138/82 | HR 76 | Ht 70.0 in | Wt 208.0 lb

## 2017-01-20 DIAGNOSIS — I10 Essential (primary) hypertension: Secondary | ICD-10-CM | POA: Diagnosis not present

## 2017-01-20 DIAGNOSIS — J309 Allergic rhinitis, unspecified: Secondary | ICD-10-CM

## 2017-01-20 DIAGNOSIS — Z5181 Encounter for therapeutic drug level monitoring: Secondary | ICD-10-CM

## 2017-01-20 LAB — BASIC METABOLIC PANEL
BUN: 10 mg/dL (ref 7–25)
CALCIUM: 9.9 mg/dL (ref 8.6–10.3)
CO2: 25 mmol/L (ref 20–31)
Chloride: 102 mmol/L (ref 98–110)
Creat: 0.7 mg/dL (ref 0.70–1.33)
GLUCOSE: 96 mg/dL (ref 65–99)
POTASSIUM: 3.8 mmol/L (ref 3.5–5.3)
SODIUM: 139 mmol/L (ref 135–146)

## 2017-01-20 NOTE — Patient Instructions (Addendum)
  BP improved, but still borderline. Continue current medication. Continue low sodium diet. Check blood pressure at least once a week and in a month or two send [place] the values through MyChart (sooner if they are getting too high or if you have concerns). Definitely send [place] your list before you need your next refill in June.  Hoping to see the numbers drop with time, as you remain active and are able to lose some inches at your waist.   Continue the zyrtec daily. Use mucinex as needed for any thick postnasal drip that causes cough or chest congestion. Use the Nasacort EVERY DAY.

## 2017-01-30 DIAGNOSIS — L720 Epidermal cyst: Secondary | ICD-10-CM | POA: Diagnosis not present

## 2017-02-02 ENCOUNTER — Encounter: Payer: Self-pay | Admitting: Family Medicine

## 2017-02-04 ENCOUNTER — Encounter: Payer: Self-pay | Admitting: Family Medicine

## 2017-02-24 ENCOUNTER — Encounter: Payer: Self-pay | Admitting: Family Medicine

## 2017-02-24 DIAGNOSIS — I10 Essential (primary) hypertension: Secondary | ICD-10-CM

## 2017-02-24 MED ORDER — LISINOPRIL-HYDROCHLOROTHIAZIDE 20-12.5 MG PO TABS: 1.0000 | ORAL_TABLET | Freq: Every day | ORAL | 1 refills | Status: DC

## 2017-03-01 ENCOUNTER — Encounter: Payer: Self-pay | Admitting: Family Medicine

## 2017-03-03 MED ORDER — AZITHROMYCIN 250 MG PO TABS: ORAL_TABLET | ORAL | 0 refills | Status: DC

## 2017-03-05 ENCOUNTER — Encounter: Payer: Self-pay | Admitting: Family Medicine

## 2017-03-11 ENCOUNTER — Encounter: Payer: Self-pay | Admitting: Family Medicine

## 2017-06-14 ENCOUNTER — Other Ambulatory Visit: Payer: Self-pay | Admitting: Family Medicine

## 2017-06-14 DIAGNOSIS — J301 Allergic rhinitis due to pollen: Secondary | ICD-10-CM

## 2017-06-30 ENCOUNTER — Encounter: Payer: Self-pay | Admitting: Family Medicine

## 2017-06-30 ENCOUNTER — Other Ambulatory Visit (INDEPENDENT_AMBULATORY_CARE_PROVIDER_SITE_OTHER): Payer: BLUE CROSS/BLUE SHIELD

## 2017-06-30 DIAGNOSIS — Z23 Encounter for immunization: Secondary | ICD-10-CM | POA: Diagnosis not present
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
## 2017-07-21 ENCOUNTER — Encounter: Payer: Self-pay | Admitting: Family Medicine

## 2017-07-21 ENCOUNTER — Ambulatory Visit (INDEPENDENT_AMBULATORY_CARE_PROVIDER_SITE_OTHER): Payer: BLUE CROSS/BLUE SHIELD | Admitting: Family Medicine

## 2017-07-21 VITALS — BP 138/86 | HR 84 | Ht 69.25 in | Wt 206.2 lb

## 2017-07-21 DIAGNOSIS — Z125 Encounter for screening for malignant neoplasm of prostate: Secondary | ICD-10-CM | POA: Diagnosis not present

## 2017-07-21 DIAGNOSIS — Z0000 Encounter for general adult medical examination without abnormal findings: Secondary | ICD-10-CM | POA: Diagnosis not present

## 2017-07-21 DIAGNOSIS — B001 Herpesviral vesicular dermatitis: Secondary | ICD-10-CM | POA: Diagnosis not present

## 2017-07-21 DIAGNOSIS — R7301 Impaired fasting glucose: Secondary | ICD-10-CM | POA: Diagnosis not present

## 2017-07-21 DIAGNOSIS — J309 Allergic rhinitis, unspecified: Secondary | ICD-10-CM

## 2017-07-21 DIAGNOSIS — Z5181 Encounter for therapeutic drug level monitoring: Secondary | ICD-10-CM

## 2017-07-21 DIAGNOSIS — E669 Obesity, unspecified: Secondary | ICD-10-CM | POA: Diagnosis not present

## 2017-07-21 DIAGNOSIS — I10 Essential (primary) hypertension: Secondary | ICD-10-CM

## 2017-07-21 DIAGNOSIS — F411 Generalized anxiety disorder: Secondary | ICD-10-CM | POA: Diagnosis not present

## 2017-07-21 DIAGNOSIS — J301 Allergic rhinitis due to pollen: Secondary | ICD-10-CM | POA: Diagnosis not present

## 2017-07-21 LAB — POCT URINALYSIS DIP (PROADVANTAGE DEVICE)
BILIRUBIN UA: NEGATIVE mg/dL
Bilirubin, UA: NEGATIVE
GLUCOSE UA: NEGATIVE mg/dL
Leukocytes, UA: NEGATIVE
Nitrite, UA: NEGATIVE
PROTEIN UA: NEGATIVE mg/dL
RBC UA: NEGATIVE
SPECIFIC GRAVITY, URINE: 1.015
UUROB: NEGATIVE
pH, UA: 7.5 (ref 5.0–8.0)

## 2017-07-21 MED ORDER — FLUTICASONE PROPIONATE 50 MCG/ACT NA SUSP: 2.0000 | Freq: Every day | NASAL | 3 refills | Status: DC

## 2017-07-21 MED ORDER — ALPRAZOLAM 1 MG PO TABS: 0.5000 mg | ORAL_TABLET | Freq: Every evening | ORAL | 0 refills | Status: DC | PRN

## 2017-07-21 MED ORDER — MONTELUKAST SODIUM 10 MG PO TABS: 10.0000 mg | ORAL_TABLET | Freq: Every day | ORAL | 3 refills | Status: DC

## 2017-07-21 MED ORDER — LISINOPRIL 30 MG PO TABS: 30.0000 mg | ORAL_TABLET | Freq: Every day | ORAL | 0 refills | Status: DC

## 2017-07-21 MED ORDER — HYDROCHLOROTHIAZIDE 12.5 MG PO CAPS: 12.5000 mg | ORAL_CAPSULE | Freq: Every day | ORAL | 1 refills | Status: DC

## 2017-07-21 NOTE — Patient Instructions (Signed)
  HEALTH MAINTENANCE RECOMMENDATIONS:  It is recommended that you get at least 30 minutes of aerobic exercise at least 5 days/week (for weight loss, you may need as much as 60-90 minutes). This can be any activity that gets your heart rate up. This can be divided in 10-15 minute intervals if needed, but try and build up your endurance at least once a week.  Weight bearing exercise is also recommended twice weekly.  Eat a healthy diet with lots of vegetables, fruits and fiber.  "Colorful" foods have a lot of vitamins (ie green vegetables, tomatoes, red peppers, etc).  Limit sweet tea, regular sodas and alcoholic beverages, all of which has a lot of calories and sugar.  Up to 2 alcoholic drinks daily may be beneficial for men (unless trying to lose weight, watch sugars).  Drink a lot of water.  Sunscreen of at least SPF 30 should be used on all sun-exposed parts of the skin when outside between the hours of 10 am and 4 pm (not just when at beach or pool, but even with exercise, golf, tennis, and yard work!)  Use a sunscreen that says "broad spectrum" so it covers both UVA and UVB rays, and make sure to reapply every 1-2 hours.  Remember to change the batteries in your smoke detectors when changing your clock times in the spring and fall.  Use your seat belt every time you are in a car, and please drive safely and not be distracted with cell phones and texting while driving.  I recommend getting the new shingles vaccine (Shingrix). You will need to check with your insurance to see if it is covered, and if covered by Medicare Part D, you need to get from the pharmacy rather than our office.  It is a series of 2 injections, spaced 2 months apart.  You are due for your colonoscopy in January 2019.  Please call your GI's office to schedule (you likely will get a letter in the mail soon).

## 2017-07-22 LAB — CBC WITH DIFFERENTIAL/PLATELET
BASOS PCT: 1.2 %
Basophils Absolute: 68 cells/uL (ref 0–200)
EOS ABS: 68 {cells}/uL (ref 15–500)
Eosinophils Relative: 1.2 %
HCT: 43.3 % (ref 38.5–50.0)
HEMOGLOBIN: 15.3 g/dL (ref 13.2–17.1)
LYMPHS ABS: 1858 {cells}/uL (ref 850–3900)
MCH: 29.9 pg (ref 27.0–33.0)
MCHC: 35.3 g/dL (ref 32.0–36.0)
MCV: 84.7 fL (ref 80.0–100.0)
MPV: 10.7 fL (ref 7.5–12.5)
Monocytes Relative: 10.1 %
Neutro Abs: 3129 cells/uL (ref 1500–7800)
Neutrophils Relative %: 54.9 %
Platelets: 273 10*3/uL (ref 140–400)
RBC: 5.11 10*6/uL (ref 4.20–5.80)
RDW: 12.5 % (ref 11.0–15.0)
Total Lymphocyte: 32.6 %
WBC mixed population: 576 cells/uL (ref 200–950)
WBC: 5.7 10*3/uL (ref 3.8–10.8)

## 2017-07-22 LAB — COMPREHENSIVE METABOLIC PANEL
AG Ratio: 1.5 (calc) (ref 1.0–2.5)
ALT: 18 U/L (ref 9–46)
AST: 17 U/L (ref 10–35)
Albumin: 4.4 g/dL (ref 3.6–5.1)
Alkaline phosphatase (APISO): 71 U/L (ref 40–115)
BUN: 11 mg/dL (ref 7–25)
CHLORIDE: 101 mmol/L (ref 98–110)
CO2: 28 mmol/L (ref 20–32)
CREATININE: 0.85 mg/dL (ref 0.70–1.25)
Calcium: 9.6 mg/dL (ref 8.6–10.3)
GLOBULIN: 2.9 g/dL (ref 1.9–3.7)
GLUCOSE: 105 mg/dL — AB (ref 65–99)
Potassium: 4 mmol/L (ref 3.5–5.3)
Sodium: 139 mmol/L (ref 135–146)
Total Bilirubin: 0.7 mg/dL (ref 0.2–1.2)
Total Protein: 7.3 g/dL (ref 6.1–8.1)

## 2017-07-22 LAB — HEMOGLOBIN A1C
Hgb A1c MFr Bld: 5.5 % of total Hgb (ref <5.7)
MEAN PLASMA GLUCOSE: 111 (calc)
eAG (mmol/L): 6.2 (calc)

## 2017-07-22 LAB — LIPID PANEL
Cholesterol: 171 mg/dL (ref <200)
HDL: 49 mg/dL (ref >40)
LDL Cholesterol (Calc): 96 mg/dL (calc)
NON-HDL CHOLESTEROL (CALC): 122 mg/dL (ref <130)
TRIGLYCERIDES: 158 mg/dL — AB (ref <150)
Total CHOL/HDL Ratio: 3.5 (calc) (ref <5.0)

## 2017-07-22 LAB — PSA: PSA: 0.7 ng/mL (ref <=4.0)

## 2017-07-22 LAB — TSH: TSH: 1.25 mIU/L (ref 0.40–4.50)

## 2017-07-23 ENCOUNTER — Encounter: Payer: Self-pay | Admitting: Family Medicine

## 2017-08-08 ENCOUNTER — Other Ambulatory Visit (INDEPENDENT_AMBULATORY_CARE_PROVIDER_SITE_OTHER): Payer: BLUE CROSS/BLUE SHIELD

## 2017-08-08 DIAGNOSIS — Z23 Encounter for immunization: Secondary | ICD-10-CM | POA: Diagnosis not present

## 2017-08-17 ENCOUNTER — Other Ambulatory Visit: Payer: Self-pay | Admitting: Family Medicine

## 2017-08-17 DIAGNOSIS — I10 Essential (primary) hypertension: Secondary | ICD-10-CM

## 2017-08-19 NOTE — Progress Notes (Signed)
Chief Complaint  Patient presents with  . Follow-up    blood pressure follow up.    Patient presents for 1 month follow-up on hypertension.  His blood pressure was high at his physical last month, and home BP's had been borderline.  He was changed from lisinopril HCT 20/12.5 to lisinopril 51m and HCTZ 12.537m  (slight increase in lisinopril dose).   BP's at home have been running 132/72, 135/74, 131/72. He reports they are "much better".  Denies headache, dizziness, cough, edema, muscle cramps, chest pain.  He got Shingrix earlier this month, and is scheduled for his second in February (per his scheduling request).   PMH, PSMcNarySH reviewed  Outpatient Encounter Medications as of 08/20/2017  Medication Sig Note  . ALPRAZolam (XANAX) 1 MG tablet Take 0.5-1 tablets (0.5-1 mg total) by mouth at bedtime as needed for sleep.   . fluticasone (FLONASE) 50 MCG/ACT nasal spray Place 2 sprays into both nostrils daily.   . hydrochlorothiazide (MICROZIDE) 12.5 MG capsule Take 1 capsule (12.5 mg total) by mouth daily.   . hydrocortisone 2.5 % ointment APPLY TO AFFECTED AREA TWICE A DAY 07/21/2017: Uses prn rashes  . lisinopril (PRINIVIL,ZESTRIL) 30 MG tablet Take 1 tablet (30 mg total) by mouth daily.   . Marland Kitchenoratadine (CLARITIN) 10 MG tablet Take 10 mg by mouth daily.   . montelukast (SINGULAIR) 10 MG tablet Take 1 tablet (10 mg total) by mouth at bedtime.   . valACYclovir (VALTREX) 1000 MG tablet TAKE 2 TABLETS AT ONSET OF COLD SORE. REPEAT ONCE IN 12 HOURS (TOTAL OF 4 TABLETS PER COURSE)   . [DISCONTINUED] azithromycin (ZITHROMAX) 250 MG tablet Take 2 tablets by mouth on first day, then 1 tablet by mouth on days 2 through 5 (Patient not taking: Reported on 07/21/2017)    No facility-administered encounter medications on file as of 08/20/2017.    No Known Allergies  ROS: no fever, chills, headaches, dizziness, chest pain, shortness of breath, GI or GU complaints, muscle cramps, edema or other  concerns. Moods are good.   PHYSICAL EXAM:  BP 130/80   Pulse 88   Ht 5' 9.5" (1.765 m)   Wt 209 lb 12.8 oz (95.2 kg)   BMI 30.54 kg/m   138/80 on repeat by MD  Wt Readings from Last 3 Encounters:  07/21/17 206 lb 3.2 oz (93.5 kg)  01/20/17 208 lb (94.3 kg)  12/18/16 208 lb 3.2 oz (94.4 kg)   Well appearing, pleasant male, in good spirits HEENT: conjunctiva and sclera are clear, EOMI, Op clear Neck: no lymphadenopathy or mass Heart: regular rate and rhythm, no murmur Lungs: clear bilaterally Extremities: no edema Neuro: alert and oriented, cranial nerves intact, normal gait   ASSESSMENT/PLAN:  Essential hypertension, benign - improved control. Continue current regimen, along with low sodium diet, weight loss. - Plan: Basic metabolic panel  Medication monitoring encounter - Plan: Basic metabolic panel   b-met today  F/u 5 mos for med check, CPE 06/2018

## 2017-08-20 ENCOUNTER — Ambulatory Visit: Payer: BLUE CROSS/BLUE SHIELD | Admitting: Family Medicine

## 2017-08-20 ENCOUNTER — Encounter: Payer: Self-pay | Admitting: Family Medicine

## 2017-08-20 VITALS — BP 130/80 | HR 88 | Ht 69.5 in | Wt 209.8 lb

## 2017-08-20 DIAGNOSIS — I10 Essential (primary) hypertension: Secondary | ICD-10-CM

## 2017-08-20 DIAGNOSIS — Z5181 Encounter for therapeutic drug level monitoring: Secondary | ICD-10-CM | POA: Diagnosis not present

## 2017-08-20 LAB — BASIC METABOLIC PANEL
BUN: 15 mg/dL (ref 7–25)
CALCIUM: 10.1 mg/dL (ref 8.6–10.3)
CO2: 28 mmol/L (ref 20–32)
CREATININE: 0.83 mg/dL (ref 0.70–1.25)
Chloride: 101 mmol/L (ref 98–110)
GLUCOSE: 117 mg/dL — AB (ref 65–99)
POTASSIUM: 4.1 mmol/L (ref 3.5–5.3)
Sodium: 139 mmol/L (ref 135–146)

## 2017-08-21 ENCOUNTER — Encounter: Payer: Self-pay | Admitting: Family Medicine

## 2017-08-25 DIAGNOSIS — L821 Other seborrheic keratosis: Secondary | ICD-10-CM | POA: Diagnosis not present

## 2017-08-25 DIAGNOSIS — Z85828 Personal history of other malignant neoplasm of skin: Secondary | ICD-10-CM | POA: Diagnosis not present

## 2017-08-25 DIAGNOSIS — L820 Inflamed seborrheic keratosis: Secondary | ICD-10-CM | POA: Diagnosis not present

## 2017-09-14 ENCOUNTER — Other Ambulatory Visit: Payer: Self-pay | Admitting: Family Medicine

## 2017-09-14 DIAGNOSIS — I10 Essential (primary) hypertension: Secondary | ICD-10-CM

## 2017-10-27 DIAGNOSIS — Z1211 Encounter for screening for malignant neoplasm of colon: Secondary | ICD-10-CM | POA: Diagnosis not present

## 2017-10-28 ENCOUNTER — Other Ambulatory Visit: Payer: Self-pay | Admitting: Family Medicine

## 2017-10-28 DIAGNOSIS — B001 Herpesviral vesicular dermatitis: Secondary | ICD-10-CM

## 2017-10-29 NOTE — Telephone Encounter (Signed)
Is this okay to refill? 

## 2017-11-07 ENCOUNTER — Other Ambulatory Visit (INDEPENDENT_AMBULATORY_CARE_PROVIDER_SITE_OTHER): Payer: BLUE CROSS/BLUE SHIELD

## 2017-11-07 ENCOUNTER — Encounter: Payer: Self-pay | Admitting: Family Medicine

## 2017-11-07 DIAGNOSIS — Z23 Encounter for immunization: Secondary | ICD-10-CM

## 2017-11-18 DIAGNOSIS — K635 Polyp of colon: Secondary | ICD-10-CM | POA: Diagnosis not present

## 2017-11-18 DIAGNOSIS — D123 Benign neoplasm of transverse colon: Secondary | ICD-10-CM | POA: Diagnosis not present

## 2017-11-18 DIAGNOSIS — Z1211 Encounter for screening for malignant neoplasm of colon: Secondary | ICD-10-CM | POA: Diagnosis not present

## 2017-11-18 DIAGNOSIS — K5730 Diverticulosis of large intestine without perforation or abscess without bleeding: Secondary | ICD-10-CM | POA: Diagnosis not present

## 2017-11-18 LAB — HM COLONOSCOPY

## 2017-11-20 ENCOUNTER — Encounter: Payer: Self-pay | Admitting: *Deleted

## 2017-11-24 ENCOUNTER — Encounter: Payer: Self-pay | Admitting: Family Medicine

## 2017-11-25 MED ORDER — AZITHROMYCIN 250 MG PO TABS
ORAL_TABLET | ORAL | 0 refills | Status: DC
Start: 2017-11-25 — End: 2018-02-16

## 2017-12-01 ENCOUNTER — Encounter: Payer: Self-pay | Admitting: Family Medicine

## 2017-12-31 ENCOUNTER — Other Ambulatory Visit: Payer: Self-pay | Admitting: Family Medicine

## 2017-12-31 DIAGNOSIS — I10 Essential (primary) hypertension: Secondary | ICD-10-CM

## 2018-01-04 ENCOUNTER — Other Ambulatory Visit: Payer: Self-pay | Admitting: Family Medicine

## 2018-01-04 DIAGNOSIS — I10 Essential (primary) hypertension: Secondary | ICD-10-CM

## 2018-01-12 DIAGNOSIS — H2513 Age-related nuclear cataract, bilateral: Secondary | ICD-10-CM | POA: Diagnosis not present

## 2018-01-12 DIAGNOSIS — H5203 Hypermetropia, bilateral: Secondary | ICD-10-CM | POA: Diagnosis not present

## 2018-01-22 ENCOUNTER — Encounter: Payer: BLUE CROSS/BLUE SHIELD | Admitting: Family Medicine

## 2018-01-23 ENCOUNTER — Encounter: Payer: Self-pay | Admitting: Family Medicine

## 2018-02-14 NOTE — Progress Notes (Signed)
Chief Complaint  Patient presents with  . Hypertension    nonfasting med check.     Patient presents for follow up on hypertension.  He feels well and has no complaints.  He was changed from lisinopril HCT 20/12.5 to lisinopril 13m and HCTZ 12.59m(slight increase in lisinopril dose) at his physical 6 months ago.   BP's at home have been running 107-132/66-86 (mostly 128-130/75-84).  He hasn't brought this cuff with him to verify the accuracy; this is a wrist monitor. He does report resting his arm on an armrest, likely below heart level.  Denies headache, dizziness, cough, edema, muscle cramps, chest pain. Eating bananas. He is trying to limit sodium in his diet. He continues to exercise regularly--golf, walking, gym. Planning to run 5KMoran early June.  Allergies: He is taking singulair and zyrtec daily. He no longer uses Flonase (got bumps in his nose), doing well without it.  Herpes Labialis: Infrequent flare, usually related to travel, sun, stress.  Flares with going to air shows and playing golf (being in the sun).   Anxiety/insomnia: He now uses 1/2 tablet of alprazolam intermittently to help with sleep, usually related to travel.Last filled #30 in 06/2017, and is requesting a refill to have on hand.Not using any benadryl recently.  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   Fasting glucose was elevated at 104 at his physical last year.  At his visit (nonfasting) 6 mos later he was screened for DM with A1c, which was normal. Last chem was not fasting. Lab Results  Component Value Date   HGBA1C 5.5 07/21/2017   He is eating more salads; "I still like my pasta", not much bread.  Occasional dessert; eats more fruits. Lost a few pounds from his last visit.  PMH, PSH, SH reviewed  Outpatient Encounter Medications as of  02/16/2018  Medication Sig Note  . ALPRAZolam (XANAX) 1 MG tablet Take 0.5-1 tablets (0.5-1 mg total) by mouth at bedtime as needed for sleep.   . cetirizine (ZYRTEC) 10 MG tablet Take 10 mg by mouth daily.   . hydrochlorothiazide (MICROZIDE) 12.5 MG capsule TAKE 1 CAPSULE BY MOUTH EVERY DAY   . lisinopril (PRINIVIL,ZESTRIL) 30 MG tablet TAKE 1 TABLET BY MOUTH EVERY DAY   . montelukast (SINGULAIR) 10 MG tablet Take 1 tablet (10 mg total) by mouth at bedtime.   . [DISCONTINUED] ALPRAZolam (XANAX) 1 MG tablet Take 0.5-1 tablets (0.5-1 mg total) by mouth at bedtime as needed for sleep.   . [DISCONTINUED] fluticasone (FLONASE) 50 MCG/ACT nasal spray Place 2 sprays into both nostrils daily.   . hydrocortisone 2.5 % ointment APPLY TO AFFECTED AREA TWICE A DAY 07/21/2017: Uses prn rashes  . loratadine (CLARITIN) 10 MG tablet Take 10 mg by mouth daily.   . valACYclovir (VALTREX) 1000 MG tablet TAKE 2 TABLETS AT ONSET OF COLD SORE. REPEAT ONCE IN 12 HOURS (TOTAL OF 4 TABLETS PER COURSE) (Patient not taking: No sig reported)   . [DISCONTINUED] azithromycin (ZITHROMAX) 250 MG tablet Take 2 tablets by mouth on first day, then 1 tablet by mouth on days 2 through 5    No facility-administered encounter medications on file as of 02/16/2018.    No Known Allergies  ROS:  No fever, chills, URI symptoms, headaches, dizziness, chest pain, shortness of breath, palpitations, edema, muscle cramps.  Only occasional heartburn. No bowel changes, rashes, depression, or other complaints except as noted in HPI.  PHYSICAL EXAM:  BP (!) 160/90   Pulse 64   Ht 5' 9.5" (1.765 m)   Wt 206 lb (93.4 kg)   BMI 29.98 kg/m   134/80 on repeat by MD  Wt Readings from Last 3 Encounters:  02/16/18 206 lb (93.4 kg)  08/20/17 209 lb 12.8 oz (95.2 kg)  07/21/17 206 lb 3.2 oz (93.5 kg)    Well developed, pleasant male in good spirits HEENT: conjunctiva and sclera are clear, EOMI. OP clear. Neck: no lymphadenopathy, thyromegaly  or mass, no bruit. Heart: regular rate and rhythm without murmur Lungs: clear bilaterally Abdomen: obese, soft, nontender, no organomegaly or mass Back: no spinal or CVA tenderness Extremities: no edema, normal pulses Skin: normal turgor, no rash Psych: normal mood, affect, hygiene and grooming Neuro: alert and oriented, cranial nerves intact, normal gait   ASSESSMENT/PLAN:  Essential hypertension, benign - controlled (elevated initially, improved on repeat). Need to verify accuracy of wrist monitor. Cont current regimen  Anxiety state - resolved, mostly now insomnia related to travel - Plan: ALPRAZolam (XANAX) 1 MG tablet  Allergic rhinitis, unspecified seasonality, unspecified trigger - controlled on current regimen  Impaired fasting glucose - counseled re: limiting carbs/sugar, daily exercise, weight loss (especially at waist).   BMI 29.0-29.9,adult - with central obesity (abdominal); counseled re: diet/exercise/weight loss, risks of obesity   CPE scheduled for 06/2018 Prefers to come come fasting (will be due for CBC, c-met, lipids, A1c, PSA)

## 2018-02-16 ENCOUNTER — Encounter: Payer: Self-pay | Admitting: Family Medicine

## 2018-02-16 ENCOUNTER — Ambulatory Visit: Payer: BLUE CROSS/BLUE SHIELD | Admitting: Family Medicine

## 2018-02-16 VITALS — BP 134/80 | HR 64 | Ht 69.5 in | Wt 206.0 lb

## 2018-02-16 DIAGNOSIS — Z6829 Body mass index (BMI) 29.0-29.9, adult: Secondary | ICD-10-CM

## 2018-02-16 DIAGNOSIS — R7301 Impaired fasting glucose: Secondary | ICD-10-CM

## 2018-02-16 DIAGNOSIS — I10 Essential (primary) hypertension: Secondary | ICD-10-CM | POA: Diagnosis not present

## 2018-02-16 DIAGNOSIS — J309 Allergic rhinitis, unspecified: Secondary | ICD-10-CM | POA: Diagnosis not present

## 2018-02-16 DIAGNOSIS — F411 Generalized anxiety disorder: Secondary | ICD-10-CM | POA: Diagnosis not present

## 2018-02-16 MED ORDER — ALPRAZOLAM 1 MG PO TABS: 0.5000 mg | ORAL_TABLET | Freq: Every evening | ORAL | 0 refills | Status: DC | PRN

## 2018-02-16 NOTE — Patient Instructions (Signed)
Please schedule nurse visit to have your blood pressure monitor checked, to ensure that it is accurate. Be sure to keep your wrist at heart level (read the directions).  Continue to try and get 150 minutes each week of aerobic exercise--good luck with your 5K!  Work on trying to cut back on portions, carbs and lose inches at the waist before your next visit.

## 2018-03-02 ENCOUNTER — Other Ambulatory Visit: Payer: BLUE CROSS/BLUE SHIELD

## 2018-03-02 ENCOUNTER — Telehealth: Payer: Self-pay | Admitting: *Deleted

## 2018-03-02 NOTE — Telephone Encounter (Signed)
Patient came in for NV to verify machine. I got 122/82 and machine was 126/75.

## 2018-03-16 ENCOUNTER — Other Ambulatory Visit: Payer: Self-pay | Admitting: Family Medicine

## 2018-03-16 DIAGNOSIS — B001 Herpesviral vesicular dermatitis: Secondary | ICD-10-CM

## 2018-03-16 NOTE — Telephone Encounter (Signed)
Is this okay to refill? 

## 2018-03-28 ENCOUNTER — Other Ambulatory Visit: Payer: Self-pay | Admitting: Family Medicine

## 2018-03-28 DIAGNOSIS — I10 Essential (primary) hypertension: Secondary | ICD-10-CM

## 2018-04-02 ENCOUNTER — Other Ambulatory Visit: Payer: Self-pay | Admitting: Family Medicine

## 2018-04-02 DIAGNOSIS — I10 Essential (primary) hypertension: Secondary | ICD-10-CM

## 2018-04-12 ENCOUNTER — Other Ambulatory Visit: Payer: Self-pay | Admitting: Family Medicine

## 2018-04-12 DIAGNOSIS — B001 Herpesviral vesicular dermatitis: Secondary | ICD-10-CM

## 2018-06-12 ENCOUNTER — Encounter: Payer: Self-pay | Admitting: Family Medicine

## 2018-06-12 MED ORDER — AZITHROMYCIN 250 MG PO TABS: ORAL_TABLET | ORAL | 0 refills | Status: DC

## 2018-06-23 ENCOUNTER — Other Ambulatory Visit: Payer: Self-pay | Admitting: Family Medicine

## 2018-06-23 DIAGNOSIS — I10 Essential (primary) hypertension: Secondary | ICD-10-CM

## 2018-06-23 NOTE — Telephone Encounter (Signed)
Pt is scheduled for cpe in october

## 2018-06-26 ENCOUNTER — Other Ambulatory Visit: Payer: Self-pay | Admitting: Family Medicine

## 2018-06-26 DIAGNOSIS — I10 Essential (primary) hypertension: Secondary | ICD-10-CM

## 2018-06-28 ENCOUNTER — Other Ambulatory Visit: Payer: Self-pay | Admitting: Family Medicine

## 2018-06-28 DIAGNOSIS — I10 Essential (primary) hypertension: Secondary | ICD-10-CM

## 2018-06-29 NOTE — Telephone Encounter (Signed)
Has an appt in october 

## 2018-06-30 ENCOUNTER — Encounter: Payer: Self-pay | Admitting: Family Medicine

## 2018-07-01 ENCOUNTER — Other Ambulatory Visit (INDEPENDENT_AMBULATORY_CARE_PROVIDER_SITE_OTHER): Payer: BLUE CROSS/BLUE SHIELD

## 2018-07-01 ENCOUNTER — Other Ambulatory Visit: Payer: Self-pay | Admitting: Family Medicine

## 2018-07-01 DIAGNOSIS — Z23 Encounter for immunization: Secondary | ICD-10-CM | POA: Diagnosis not present

## 2018-07-01 DIAGNOSIS — I10 Essential (primary) hypertension: Secondary | ICD-10-CM

## 2018-07-25 NOTE — Progress Notes (Signed)
Chief Complaint  Patient presents with  . Annual Exam    fasting annual exam. Had eye exam. No concerns.   . Medication Refill    would like refills on xanax and zpak for travel.     Tyler Martin is a 62 y.o. male who presents for a complete physical.  He is requesting refill on alprazolam and z-pak--he uses the xanax mostly related to travel, and likes to have a zpak on hand when traveling, in case he gets sick.  He rarely needs, this, feels better having it.  He did just call for one when traveling last month, and symptoms completely resolved.   He has lost weight since starting [employer] 5&1 plan, which he started on 7/20 (morning shake, healthy diet). This is his last month on the program.  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  Hypertension follow-up: He reports compliance in taking lisinopril 30mg  and HCTZ 12.5mg . BP's at home have been running110-118/65-71. BP monitor was verified as accurate in June. Only rarely feels dizzy when he stands up quickly. Denies headache, cough, edema, muscle cramps, chest pain. Eating bananas.He is trying to limit sodium in his diet. He continues to exercise regularly.  Allergies: He istakingsingulair and claritin (alternates periodically with zyrtec) daily. He no longer uses Flonase (got bumps in his nose), doing well without it.  Herpes Labialis: Infrequent flare,usually related to travel, sun, stress.Flares with going to air shows and playing golf (being in the sun).  hasn't had any recent flares.  Anxiety/insomnia: He now uses 1/2 tablet of alprazolam intermittently to help with sleep, usually related to travel.Last filled #30 in 01/2018. He has a few left, requesting a refill.  Fasting glucose was elevated at 104 at his physical last year. A1c was normal. Lab Results  Component Value Date   HGBA1C 5.5 07/21/2017   He is eating more salads; Cut back on pasta, breads. Eats "smarter" desserts, more  fruits. Has lost weight as stated above.  Immunization History  Administered Date(s) Administered  . Influenza Split 06/11/2012  . Influenza, High Dose Seasonal PF 07/01/2018  . Influenza,inj,Quad PF,6+ Mos 06/02/2013, 06/02/2014, 06/07/2015, 06/05/2016, 06/30/2017  . Tdap 08/30/2010  . Zoster Recombinat (Shingrix) 08/08/2017, 11/07/2017   Last colonoscopy: 10/2017, had tubular adenoma, diverticulosis Last PSA: 1 year ago  Lab Results  Component Value Date   PSA 0.7 07/21/2017   PSA 0.6 06/05/2016   PSA 1.61 06/02/2014  Dentist: twice yearly  Ophtho: yearly  Exercise: Daily; uses weights and resistance bands 3x/week, and walking/jogging 30 minutes at least 5x/week, and sometimes also walks at lunch. Golf frequently and bowls.  Past Medical History:  Diagnosis Date  . Allergic rhinitis, cause unspecified    seasonal  . Anxiety state, unspecified    resolved, related to death of nephew  . Diverticulosis 10/13/07   on colonoscopy (Dr. Elnoria Howard)  . Hemorrhoids 10/13/07  . Herpes labialis    (prev treated by Dr. Yetta Barre with Valtrex 1 gm)  . Hypertension 2012    History reviewed. No pertinent surgical history.  Social History   Socioeconomic History  . Marital status: Married    Spouse name: Not on file  . Number of children: 2  . Years of education: Not on file  . Highest education level: Not on file  Occupational History  . 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 Financial resource strain: Not on file  .  Food insecurity:    Worry: Not on file    Inability: Not on file  . Transportation needs:    Medical: Not on file    Non-medical: Not on file  Tobacco Use  . Smoking status: Former Smoker    Packs/day: 1.00    Years: 30.00    Pack years: 30.00    Types: Cigarettes    Last attempt to quit: 09/30/1998    Years since quitting: 19.8  . Smokeless tobacco: Never Used  Substance and Sexual Activity  . Alcohol  use: Yes    Comment: 2-4 drinks per week.  . Drug use: No  . Sexual activity: Yes    Partners: Female  Lifestyle  . Physical activity:    Days per week: Not on file    Minutes per session: Not on file  . Stress: Not on file  Relationships  . Social connections:    Talks on phone: Not on file    Gets together: Not on file    Attends religious service: Not on file    Active member of club or organization: Not on file    Attends meetings of clubs or organizations: Not on file    Relationship status: Not on file  . Intimate partner violence:    Fear of current or ex partner: Not on file    Emotionally abused: Not on file    Physically abused: Not on file    Forced sexual activity: Not on file  Other Topics Concern  . Not on file  Social History Narrative   Married, 1 cat.  Son is an [employer] MD, currently in [place], [place] for ER residency.  Daughter lives in [place].  2 grandchildren [facility])   Updated 06/2018       Family History  Problem Relation Age of Onset  . Stroke Mother   . Hypertension Mother   . Parkinsonism Mother   . Parkinsonism Father   . Heart disease Father 22       MI  . Hypertension Sister   . Hypertension Sister   . COPD Maternal Grandfather   . Cancer Maternal Uncle        prostate  . Cancer Cousin        prostate  . Diabetes Neg Hx     Outpatient Encounter Medications as of 07/27/2018  Medication Sig Note  . hydrochlorothiazide (MICROZIDE) 12.5 MG capsule TAKE 1 CAPSULE BY MOUTH EVERY DAY   . lisinopril (PRINIVIL,ZESTRIL) 30 MG tablet TAKE 1 TABLET BY MOUTH EVERY DAY   . loratadine (CLARITIN) 10 MG tablet Take 10 mg by mouth daily.   . montelukast (SINGULAIR) 10 MG tablet Take 1 tablet (10 mg total) by mouth at bedtime.   . [DISCONTINUED] fluticasone (FLONASE) 50 MCG/ACT nasal spray    . ALPRAZolam (XANAX) 1 MG tablet Take 0.5-1 tablets (0.5-1 mg total) by mouth at bedtime as needed for sleep. (Patient not taking: Reported on 07/27/2018) 07/27/2018:  Takes 1/2 prn, usually with travel  . azithromycin (ZITHROMAX) 250 MG tablet Take 2 tablets on day 1 then 1 tablet day 2-5 (Patient not taking: Reported on 07/27/2018) 07/27/2018: Keeps on hand, to use if sick when traveling  . cetirizine (ZYRTEC) 10 MG tablet Take 10 mg by mouth daily.   . hydrocortisone 2.5 % ointment APPLY TO AFFECTED AREA TWICE A DAY 07/21/2017: Uses prn rashes  . valACYclovir (VALTREX) 1000 MG tablet TAKE 2 TABLETS AT ONSET OF COLD SORE. REPEAT ONCE IN 12 HOURS (  TOTAL OF 4 TABLETS PER COURSE) (Patient not taking: TAKE 2 TABLETS AT ONSET OF COLD SORE. REPEAT ONCE IN 12 HOURS (TOTAL OF 4 TABLETS PER COURSE)) 07/27/2018: Uses prn cold sores  . [DISCONTINUED] hydrochlorothiazide (MICROZIDE) 12.5 MG capsule TAKE 1 CAPSULE BY MOUTH EVERY DAY   . [DISCONTINUED] lisinopril (PRINIVIL,ZESTRIL) 30 MG tablet TAKE 1 TABLET BY MOUTH EVERY DAY    No facility-administered encounter medications on file as of 07/27/2018.     No Known Allergies   ROS: The patient denies anorexia, fever, headaches, vision loss, decreased hearing, ear pain, hoarseness, chest pain, palpitations, dizziness, syncope, dyspnea on exertion, cough, swelling, nausea, vomiting, diarrhea, constipation, abdominal pain, melena, hematochezia, indigestion/heartburn, hematuria, incontinence, erectile dysfunction, nocturia, weakened urine stream, dysuria, genital lesions, joint pains, numbness, tingling, weakness, tremor, suspicious skin lesions, depression, abnormal bleeding/bruising, or enlarged lymph nodes. Some intermittent insomnia, usually with travel; xanax helps prn, as per HPI. Sees Dr. Yetta Barre regularly (derm). Allergies are controlled on current meds. 15# weight loss, intentional Rare dizziness with standing, per HPI   PHYSICAL EXAM:  BP 128/78   Pulse 68   Ht 5\' 9"  (1.753 m)   Wt 191 lb 12.8 oz (87 kg)   BMI 28.32 kg/m   120/68 on repeat by MD  Wt Readings from Last 3 Encounters:  07/27/18 191 lb 12.8 oz  (87 kg)  02/16/18 206 lb (93.4 kg)  08/20/17 209 lb 12.8 oz (95.2 kg)    General Appearance:  Alert, cooperative, no distress, appears stated age   Head:  Normocephalic, without obvious abnormality, atraumatic   Eyes:  PERRL, conjunctiva/corneas clear, EOM's intact, fundi benign; small cataract noted on the left   Ears:  Normal TM's and external ear canals   Nose:  Nares normal, mucosa is minimally edematous, no drainage or sinus tenderness   Throat:  Lips, mucosa, and tongue normal; teeth and gums normal   Neck:  Supple, no lymphadenopathy; thyroid: no enlargement/tenderness/nodules; no carotid bruit or JVD   Back:  Spine nontender, no curvature, ROM normal, no CVA tenderness   Lungs:  Clear to auscultation bilaterally without wheezes, rales or ronchi; respirations unlabored   Chest Wall:  No tenderness or deformity   Heart:  Regular rate and rhythm, S1 and S2 normal, no murmur, rub or gallop   Breast Exam: No chest wall tenderness, masses or gynecomastia   Abdomen:  Soft, non-tender, obese, nondistended, normoactive bowel sounds, no masses, no hepatosplenomegaly.  Genitalia:  Normal male external genitalia without lesions. Testicles without masses; small cyst noted on left, supero-posterior to testicle, nontender. No inguinal hernias.   Rectal:  Normal sphincter tone, no masses or tenderness; guaiac negative stool. Prostate smooth, no nodules, normal size  Extremities:  No clubbing, cyanosis or edema   Pulses:  2+ and symmetric all extremities   Skin:  Skin color, texture, turgor normal, no rashes or lesions. No suspicious lesions  Lymph nodes:  Cervical, supraclavicular, and axillary nodes normal   Neurologic:  CNII-XII intact, normal strength, sensation and gait; reflexes 2+ and symmetric throughout    Psych: Normal mood, affect, hygiene and grooming, speech and eye contact   ASSESSMENT/PLAN:  Annual physical exam - Plan: POCT  Urinalysis DIP (Proadvantage Device), PSA, Comprehensive metabolic panel, CBC with Differential/Platelet, Lipid panel, Hemoglobin A1c  Essential hypertension, benign - well controlled; We discussed that if continues to lose weight, and if BP drops lower (w/dizziness), we can cut out the HCTZ and monitor - Plan: Comprehensive metabolic panel, hydrochlorothiazide (MICROZIDE) 12.5 MG  capsule, lisinopril (PRINIVIL,ZESTRIL) 30 MG tablet  Medication monitoring encounter  Screening for prostate cancer - Plan: PSA  Herpes labialis - rare flares; denies needs to refill at this time  Anxiety state - resolved, mostly now insomnia related to travel - Plan: ALPRAZolam (XANAX) 1 MG tablet  Allergic rhinitis due to pollen, unspecified seasonality - well controlled - Plan: montelukast (SINGULAIR) 10 MG tablet  Overweight (BMI 25.0-29.9) - congratulated on 15# wt loss; discussed concerns about regaining once he stops shakes, goes back to his large breakfasts.   Discussed PSA screening (risks/benefits), recommended at least 30 minutes of aerobic activity at least 5 days/week, weight-bearing exercise at least 2x/wk; proper sunscreen use reviewed; healthy diet and alcohol recommendations (less than or equal to 2 drinks/day) reviewed; regular seatbelt use; changing batteries in smoke detectors. Self-testicular exams. Immunization recommendations discussed-- continue yearly flu shots. Colonoscopy recommendations reviewed, UTD--likely 5 yr f/u recommended due to adenomatous polyp, 10/2022 (pt states he was told 7 yr f/u)  F/u 1 year for CPE, sooner prn (if changes to BP meds made)   Continue to monitor blood pressure.  If consistently having BP <105 with some associated dizziness, then we should stop the HCTZ (continue lisinopril 30mg ) and continue to monitor for change.  Goal BP is <13/80. Be sure to let us know if you make changes to your medication.

## 2018-07-25 NOTE — Patient Instructions (Addendum)
  HEALTH MAINTENANCE RECOMMENDATIONS:  It is recommended that you get at least 30 minutes of aerobic exercise at least 5 days/week (for weight loss, you may need as much as 60-90 minutes). This can be any activity that gets your heart rate up. This can be divided in 10-15 minute intervals if needed, but try and build up your endurance at least once a week.  Weight bearing exercise is also recommended twice weekly.  Eat a healthy diet with lots of vegetables, fruits and fiber.  "Colorful" foods have a lot of vitamins (ie green vegetables, tomatoes, red peppers, etc).  Limit sweet tea, regular sodas and alcoholic beverages, all of which has a lot of calories and sugar.  Up to 2 alcoholic drinks daily may be beneficial for men (unless trying to lose weight, watch sugars).  Drink a lot of water.  Sunscreen of at least SPF 30 should be used on all sun-exposed parts of the skin when outside between the hours of 10 am and 4 pm (not just when at beach or pool, but even with exercise, golf, tennis, and yard work!)  Use a sunscreen that says "broad spectrum" so it covers both UVA and UVB rays, and make sure to reapply every 1-2 hours.  Remember to change the batteries in your smoke detectors when changing your clock times in the spring and fall.  Use your seat belt every time you are in a car, and please drive safely and not be distracted with cell phones and texting while driving.  Continue to monitor blood pressure.  If consistently having BP <105 with some associated dizziness, then we should stop the HCTZ (continue lisinopril 30mg ) and continue to monitor for change.  Goal BP is <13/80. Be sure to let us know if you make changes to your medication.

## 2018-07-27 ENCOUNTER — Ambulatory Visit (INDEPENDENT_AMBULATORY_CARE_PROVIDER_SITE_OTHER): Payer: BLUE CROSS/BLUE SHIELD | Admitting: Family Medicine

## 2018-07-27 ENCOUNTER — Encounter: Payer: Self-pay | Admitting: Family Medicine

## 2018-07-27 VITALS — BP 120/68 | HR 68 | Ht 69.0 in | Wt 191.8 lb

## 2018-07-27 DIAGNOSIS — Z0000 Encounter for general adult medical examination without abnormal findings: Secondary | ICD-10-CM

## 2018-07-27 DIAGNOSIS — B001 Herpesviral vesicular dermatitis: Secondary | ICD-10-CM | POA: Diagnosis not present

## 2018-07-27 DIAGNOSIS — I10 Essential (primary) hypertension: Secondary | ICD-10-CM

## 2018-07-27 DIAGNOSIS — F411 Generalized anxiety disorder: Secondary | ICD-10-CM

## 2018-07-27 DIAGNOSIS — Z125 Encounter for screening for malignant neoplasm of prostate: Secondary | ICD-10-CM | POA: Diagnosis not present

## 2018-07-27 DIAGNOSIS — E663 Overweight: Secondary | ICD-10-CM

## 2018-07-27 DIAGNOSIS — J301 Allergic rhinitis due to pollen: Secondary | ICD-10-CM

## 2018-07-27 DIAGNOSIS — Z5181 Encounter for therapeutic drug level monitoring: Secondary | ICD-10-CM | POA: Diagnosis not present

## 2018-07-27 LAB — POCT URINALYSIS DIP (PROADVANTAGE DEVICE)
Bilirubin, UA: NEGATIVE
Glucose, UA: NEGATIVE mg/dL
Ketones, POC UA: NEGATIVE mg/dL
Leukocytes, UA: NEGATIVE
Nitrite, UA: NEGATIVE
PROTEIN UA: NEGATIVE mg/dL
RBC UA: NEGATIVE
SPECIFIC GRAVITY, URINE: 1.015
UUROB: NEGATIVE
pH, UA: 6.5 (ref 5.0–8.0)

## 2018-07-27 MED ORDER — MONTELUKAST SODIUM 10 MG PO TABS: 10.0000 mg | ORAL_TABLET | Freq: Every day | ORAL | 3 refills | Status: DC

## 2018-07-27 MED ORDER — AZITHROMYCIN 250 MG PO TABS: ORAL_TABLET | ORAL | 0 refills | Status: DC

## 2018-07-27 MED ORDER — LISINOPRIL 30 MG PO TABS: 30.0000 mg | ORAL_TABLET | Freq: Every day | ORAL | 3 refills | Status: DC

## 2018-07-27 MED ORDER — ALPRAZOLAM 1 MG PO TABS: 0.5000 mg | ORAL_TABLET | Freq: Every evening | ORAL | 0 refills | Status: DC | PRN

## 2018-07-27 MED ORDER — HYDROCHLOROTHIAZIDE 12.5 MG PO CAPS: ORAL_CAPSULE | ORAL | 3 refills | Status: DC

## 2018-07-28 ENCOUNTER — Encounter: Payer: Self-pay | Admitting: Family Medicine

## 2018-07-28 LAB — CBC WITH DIFFERENTIAL/PLATELET
BASOS ABS: 0.1 10*3/uL (ref 0.0–0.2)
Basos: 1 %
EOS (ABSOLUTE): 0.1 10*3/uL (ref 0.0–0.4)
Eos: 1 %
Hematocrit: 44.8 % (ref 37.5–51.0)
Hemoglobin: 15.4 g/dL (ref 13.0–17.7)
IMMATURE GRANULOCYTES: 0 %
Immature Grans (Abs): 0 10*3/uL (ref 0.0–0.1)
LYMPHS: 29 %
Lymphocytes Absolute: 1.6 10*3/uL (ref 0.7–3.1)
MCH: 29.8 pg (ref 26.6–33.0)
MCHC: 34.4 g/dL (ref 31.5–35.7)
MCV: 87 fL (ref 79–97)
Monocytes Absolute: 0.5 10*3/uL (ref 0.1–0.9)
Monocytes: 8 %
NEUTROS ABS: 3.4 10*3/uL (ref 1.4–7.0)
NEUTROS PCT: 61 %
PLATELETS: 237 10*3/uL (ref 150–450)
RBC: 5.17 x10E6/uL (ref 4.14–5.80)
RDW: 12.9 % (ref 12.3–15.4)
WBC: 5.6 10*3/uL (ref 3.4–10.8)

## 2018-07-28 LAB — PSA: PROSTATE SPECIFIC AG, SERUM: 1.2 ng/mL (ref 0.0–4.0)

## 2018-07-28 LAB — LIPID PANEL
Chol/HDL Ratio: 2.9 ratio (ref 0.0–5.0)
Cholesterol, Total: 146 mg/dL (ref 100–199)
HDL: 50 mg/dL (ref >39)
LDL Calculated: 75 mg/dL (ref 0–99)
Triglycerides: 104 mg/dL (ref 0–149)
VLDL Cholesterol Cal: 21 mg/dL (ref 5–40)

## 2018-07-28 LAB — COMPREHENSIVE METABOLIC PANEL
A/G RATIO: 1.8 (ref 1.2–2.2)
ALT: 16 IU/L (ref 0–44)
AST: 16 IU/L (ref 0–40)
Albumin: 4.6 g/dL (ref 3.6–4.8)
Alkaline Phosphatase: 83 IU/L (ref 39–117)
BILIRUBIN TOTAL: 0.6 mg/dL (ref 0.0–1.2)
BUN/Creatinine Ratio: 14 (ref 10–24)
BUN: 12 mg/dL (ref 8–27)
CHLORIDE: 101 mmol/L (ref 96–106)
CO2: 24 mmol/L (ref 20–29)
Calcium: 9.6 mg/dL (ref 8.6–10.2)
Creatinine, Ser: 0.83 mg/dL (ref 0.76–1.27)
GFR calc Af Amer: 110 mL/min/{1.73_m2} (ref >59)
GFR calc non Af Amer: 95 mL/min/{1.73_m2} (ref >59)
Globulin, Total: 2.6 g/dL (ref 1.5–4.5)
Glucose: 101 mg/dL — ABNORMAL HIGH (ref 65–99)
POTASSIUM: 4 mmol/L (ref 3.5–5.2)
Sodium: 140 mmol/L (ref 134–144)
Total Protein: 7.2 g/dL (ref 6.0–8.5)

## 2018-07-28 LAB — HEMOGLOBIN A1C
ESTIMATED AVERAGE GLUCOSE: 111 mg/dL
Hgb A1c MFr Bld: 5.5 % (ref 4.8–5.6)

## 2018-07-28 NOTE — Progress Notes (Signed)
Not sure, will watch it and see what happens

## 2018-10-09 ENCOUNTER — Encounter: Payer: Self-pay | Admitting: Family Medicine

## 2018-11-09 ENCOUNTER — Encounter: Payer: Self-pay | Admitting: Family Medicine

## 2018-11-09 ENCOUNTER — Ambulatory Visit: Payer: Managed Care, Other (non HMO) | Admitting: Family Medicine

## 2018-11-09 VITALS — BP 140/90 | HR 72 | Temp 98.3°F | Ht 69.0 in | Wt 192.8 lb

## 2018-11-09 DIAGNOSIS — J069 Acute upper respiratory infection, unspecified: Secondary | ICD-10-CM | POA: Diagnosis not present

## 2018-11-09 NOTE — Progress Notes (Signed)
Chief Complaint  Patient presents with  . Cough    started Wed. Has deep cough with green mucus. Some mucus in sinuses. Had low grade temp Thurs-no body aches or chills. R ear feels full. Was in [place] last week. Has been travelling a lot. Headed to [facility], hopefully this weekend.    5 days ago he started with head congestion while in [facility].  He took some Coricidin, thinks he had a lowgrade fever.  Cough got worse Friday, flew back Saturday.  Started taking Mucinex DM on Saturday.  Feels like it has moved down into his chest.  "I don't feel bad, I just still have this cough".  Cough wakes him up some at night, and is a little worse in the morning.  Yesterday morning coughed a lot, didn't go to church.  Isn't coughing much currently, but got up a lot of phlegm earlier.  Nasal drainage is clear, sometimes colored.  Phlegm was discolored this morning.  Thinks the discoloration lasts through the day (isn't sure).  No known sick contacts, but was on plane flight (flew out the Saturday before symptoms started).  He reports BPs at home are lower than what it is here, not checked in 3 weeks.  Was on Keflex the last week in Jan thrugh 2/4 for an abscess on his neck (drained by Dr. Yetta Barre).  PMH, PSH, SH reviewed  Outpatient Encounter Medications as of 11/09/2018  Medication Sig Note  . ALPRAZolam (XANAX) 1 MG tablet Take 0.5-1 tablets (0.5-1 mg total) by mouth at bedtime as needed for sleep.   . hydrochlorothiazide (MICROZIDE) 12.5 MG capsule TAKE 1 CAPSULE BY MOUTH EVERY DAY   . lisinopril (PRINIVIL,ZESTRIL) 30 MG tablet Take 1 tablet (30 mg total) by mouth daily.   Marland Kitchen loratadine (CLARITIN) 10 MG tablet Take 10 mg by mouth daily.   . montelukast (SINGULAIR) 10 MG tablet Take 1 tablet (10 mg total) by mouth at bedtime.   . [DISCONTINUED] cetirizine (ZYRTEC) 10 MG tablet Take 10 mg by mouth daily.   . hydrocortisone 2.5 % ointment APPLY TO AFFECTED AREA TWICE A DAY 07/21/2017: Uses prn rashes  .  valACYclovir (VALTREX) 1000 MG tablet TAKE 2 TABLETS AT ONSET OF COLD SORE. REPEAT ONCE IN 12 HOURS (TOTAL OF 4 TABLETS PER COURSE) (Patient not taking: TAKE 2 TABLETS AT ONSET OF COLD SORE. REPEAT ONCE IN 12 HOURS (TOTAL OF 4 TABLETS PER COURSE)) 07/27/2018: Uses prn cold sores  . [DISCONTINUED] azithromycin (ZITHROMAX) 250 MG tablet Take 2 tablets on day 1 then 1 tablet day 2-5    No facility-administered encounter medications on file as of 11/09/2018.    Taking Coricidin HBP Cold and Flu (acetaminophen and chlorpheniramine only) and Mucinex DM  No Known Allergies  ROS: no headaches, dizziness, chest pain, shortness of breath or GI complaints.  +URI symptoms per HPI.   PHYSICAL EXAM:  BP 140/90   Pulse 72   Temp 98.3 F (36.8 C) (Tympanic)   Ht 5\' 9"  (1.753 m)   Wt 192 lb 12.8 oz (87.5 kg)   BMI 28.47 kg/m   Well-appearing, pleasant male, in no distress.  No coughing during the visit until the very end.  Coughed up some thin, creamy phlegm. HEENT: PERRL, EOMI, conjunctiva and sclera are clear.  Left TM is retracted, right has very small effusion, clear, no erythema. EAC's are normal. Nasal mucosa is normal. Sinuses are nontender. OP is clear. Neck: no lymphadenopathy or mass. WHSS at left lower posterior neck (  from recent I&D. Heart: regular rate and rhythm, no murmur Lungs: clear bilaterally, no wheezes, rales, ronchi Psych: normal mood, affect, hygiene and grooming Neuro: alert and oriented, cranial nerves intact, normal gait Skin: normal turgor, no rash.  Area of recent abscess at L posterior lower neck has healed, nontender, no mass.    ASSESSMENT/PLAN:  Viral upper respiratory infection  Discussed possible rx for benzonatate, declines for now. Has rx for z-pak already, to use if needed, and discussed when that would be.    Drink plenty of water. Continue the Coricidin HBP Cold and Flu along with Mucinex DM. I do not see any evidence of bacterial infection at this  time (not in the sinuses or in the lungs or bronchial tubes). If you develop another fever, if your phlegm or mucus becomes thick and more discolored, if your symptoms worsen rather than improve over the next 3-5 days, then start the Zpak. If you need to travel by plane or are heading into the mountains, use Afrin to help prevent worsening ear pain. Let me know if symptoms worsen, if you need additional cough mediations, or any other concerns.

## 2018-11-09 NOTE — Patient Instructions (Signed)
  Drink plenty of water. Continue the Coricidin HBP Cold and Flu along with Mucinex DM. I do not see any evidence of bacterial infection at this time (not in the sinuses or in the lungs or bronchial tubes). If you develop another fever, if your phlegm or mucus becomes thick and more discolored, if your symptoms worsen rather than improve over the next 3-5 days, then start the Zpak. If you need to travel by plane or are heading into the mountains, use Afrin to help prevent worsening ear pain. Let me know if symptoms worsen, if you need additional cough mediations, or any other concerns.

## 2018-12-13 ENCOUNTER — Encounter: Payer: Self-pay | Admitting: Family Medicine

## 2018-12-13 DIAGNOSIS — F411 Generalized anxiety disorder: Secondary | ICD-10-CM

## 2018-12-13 DIAGNOSIS — B001 Herpesviral vesicular dermatitis: Secondary | ICD-10-CM

## 2018-12-14 MED ORDER — ALPRAZOLAM 1 MG PO TABS: 0.5000 mg | ORAL_TABLET | Freq: Every evening | ORAL | 0 refills | Status: DC | PRN

## 2018-12-14 MED ORDER — VALACYCLOVIR HCL 1 G PO TABS: ORAL_TABLET | ORAL | 0 refills | Status: DC

## 2018-12-15 ENCOUNTER — Encounter: Payer: Self-pay | Admitting: Family Medicine

## 2018-12-28 ENCOUNTER — Encounter: Payer: Self-pay | Admitting: Family Medicine

## 2019-01-06 ENCOUNTER — Other Ambulatory Visit: Payer: Self-pay | Admitting: Family Medicine

## 2019-01-06 DIAGNOSIS — B001 Herpesviral vesicular dermatitis: Secondary | ICD-10-CM

## 2019-02-12 ENCOUNTER — Encounter: Payer: Self-pay | Admitting: Family Medicine

## 2019-07-15 ENCOUNTER — Other Ambulatory Visit: Payer: Self-pay

## 2019-07-15 ENCOUNTER — Other Ambulatory Visit (INDEPENDENT_AMBULATORY_CARE_PROVIDER_SITE_OTHER): Payer: Managed Care, Other (non HMO)

## 2019-07-15 DIAGNOSIS — Z23 Encounter for immunization: Secondary | ICD-10-CM

## 2019-07-28 NOTE — Progress Notes (Deleted)
Tyler Martin is a 63 y.o. male who presents for a complete physical.  He has the following concerns:  Last year he reported losing weight using [place] program, completed in November.  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  Hypertension follow-up:He reports compliance in taking lisinopril 75m and HCTZ 12.550m  BP's at home have been running BP monitor was verified as accurate in June, 2019. Only rarely feels dizzy when he stands up quickly. Denies headache, cough, edema, muscle cramps, chest pain.Eating bananas.He is trying to limit sodium in his diet. He continues to exercise regularly.  Allergies:He istakingsingulairand claritin (alternates periodically with zyrtec)daily.(He previously used Flonase, stopped due to getting bumps in his nose, no longer needs it).  Herpes Labialis: Infrequent flare,usually related to travel, sun, stress.Flares with going to air shows and playing golf (being in the sun). hasn't had any recent flares.  Anxiety/insomnia: He now uses 1/2 tablet of alprazolam intermittently to help with sleep, usually related to travel.Last filled #30 in3/2020.  Fasting glucose was elevated at 101 at his physical last year, 105 the year prior. A1c's have been normal. Lab Results  Component Value Date   HGBA1C 5.5 07/27/2018   He is eating more salads;Cut back on pasta, breads. Eats "smarter" desserts, more fruits.     Immunization History  Administered Date(s) Administered  . Influenza Split 06/11/2012  . Influenza, High Dose Seasonal PF 07/01/2018  . Influenza,inj,Quad PF,6+ Mos 06/02/2013, 06/02/2014, 06/07/2015, 06/05/2016, 06/30/2017, 07/15/2019  . Tdap 08/30/2010  . Zoster Recombinat (Shingrix) 08/08/2017, 11/07/2017   Last colonoscopy: 10/2017, had tubular adenoma, diverticulosis Last PSA: 1.2 in 06/2018 Dentist: twice yearly  Ophtho: yearly  Exercise: Daily; uses weightsand resistance  bands 3x/week, and walking/jogging 30 minutes at least 5x/week, and sometimes also walks at lunch. Golf frequently and bowls.   ROS: The patient denies anorexia, fever, headaches, vision loss, decreased hearing, ear pain, hoarseness, chest pain, palpitations, dizziness, syncope, dyspnea on exertion, cough, swelling, nausea, vomiting, diarrhea, constipation, abdominal pain, melena, hematochezia, indigestion/heartburn, hematuria, incontinence, erectile dysfunction, nocturia, weakened urine stream, dysuria, genital lesions, joint pains, numbness, tingling, weakness, tremor, suspicious skin lesions, depression, abnormal bleeding/bruising, or enlarged lymph nodes. Some intermittent insomnia, usually with travel; xanax helps prn, as per HPI. Sees Dr. JoRonnald Rampegularly (derm). Allergies are controlled on current meds. Rare dizziness with standing, per HPI   PHYSICAL EXAM:  Wt Readings from Last 3 Encounters:  11/09/18 192 lb 12.8 oz (87.5 kg)  07/27/18 191 lb 12.8 oz (87 kg)  02/16/18 206 lb (93.4 kg)    General Appearance:  Alert, cooperative, no distress, appears stated age   Head:  Normocephalic, without obvious abnormality, atraumatic   Eyes:  PERRL, conjunctiva/corneas clear, EOM's intact, fundi benign; small cataract noted on the left   Ears:  Normal TM's and external ear canals   Nose:  Not examined, wearing mask due to COVID-19 pandemic  Throat:  Not examined, wearing mask due to COVID-19 pandemic   Neck:  Supple, no lymphadenopathy; thyroid: no enlargement/tenderness/nodules; no carotid bruit or JVD   Back:  Spine nontender, no curvature, ROM normal, no CVA tenderness   Lungs:  Clear to auscultation bilaterally without wheezes, rales or ronchi; respirations unlabored   Chest Wall:  No tenderness or deformity   Heart:  Regular rate and rhythm, S1 and S2 normal, no murmur, rub or gallop   Breast Exam: No chest wall tenderness, masses or gynecomastia   Abdomen:   Soft, non-tender, obese,  nondistended, normoactive bowel sounds, no masses, no hepatosplenomegaly.  Genitalia:  Normal male external genitalia without lesions. Testicles without masses; small cyst noted on left, supero-posterior to testicle, nontender. No inguinal hernias.   Rectal:  Normal sphincter tone, no masses or tenderness; guaiac negative stool. Prostate smooth, no nodules, normal size  Extremities:  No clubbing, cyanosis or edema   Pulses:  2+ and symmetric all extremities   Skin:  Skin color, texture, turgor normal, no rashes or lesions.No suspicious lesions  Lymph nodes:  Cervical, supraclavicular, and axillary nodes normal   Neurologic:  CNII-XII intact, normal strength, sensation and gait; reflexes 2+ and symmetric throughout    Psych:Normal mood, affect, hygiene and grooming, speech and eye contact  Cataract on left Cyst left scrotum UPDATE  ASSESSMENT/PLAN:  PSA, c-met, lipids, CBC (A1c with labs only if regained weight)  Refill lisinopril, HCTZ, montelukast x 1 year  Discussed PSA screening (risks/benefits), recommended at least 30 minutes of aerobic activity at least 5 days/week, weight-bearing exercise at least 2x/wk; proper sunscreen use reviewed; healthy diet and alcohol recommendations (less than or equal to 2 drinks/day) reviewed; regular seatbelt use; changing batteries in smoke detectors. Self-testicular exams. Immunization recommendations discussed--continue yearly flu shots. Colonoscopy recommendations reviewed, UTD--likely 5 yr f/u recommended due to adenomatous polyp, 10/2022 (pt states he was told 7 yr f/u)  F/u 1 year for CPE, sooner prn

## 2019-07-29 ENCOUNTER — Encounter: Payer: BLUE CROSS/BLUE SHIELD | Admitting: Family Medicine

## 2019-07-29 ENCOUNTER — Other Ambulatory Visit: Payer: Self-pay

## 2019-07-31 NOTE — Patient Instructions (Addendum)
  HEALTH MAINTENANCE RECOMMENDATIONS:  It is recommended that you get at least 30 minutes of aerobic exercise at least 5 days/week (for weight loss, you may need as much as 60-90 minutes). This can be any activity that gets your heart rate up. This can be divided in 10-15 minute intervals if needed, but try and build up your endurance at least once a week.  Weight bearing exercise is also recommended twice weekly.  Eat a healthy diet with lots of vegetables, fruits and fiber.  "Colorful" foods have a lot of vitamins (ie green vegetables, tomatoes, red peppers, etc).  Limit sweet tea, regular sodas and alcoholic beverages, all of which has a lot of calories and sugar.  Up to 2 alcoholic drinks daily may be beneficial for men (unless trying to lose weight, watch sugars).  Drink a lot of water.  Sunscreen of at least SPF 30 should be used on all sun-exposed parts of the skin when outside between the hours of 10 am and 4 pm (not just when at beach or pool, but even with exercise, golf, tennis, and yard work!)  Use a sunscreen that says "broad spectrum" so it covers both UVA and UVB rays, and make sure to reapply every 1-2 hours.  Remember to change the batteries in your smoke detectors when changing your clock times in the spring and fall. Carbon monoxide detectors are recommended for your home.  Use your seat belt every time you are in a car, and please drive safely and not be distracted with cell phones and texting while driving.  When you are a couple of weeks away from needing your lisinopril refill, send [place] a list of your blood pressures.  If they remain as low as they were today (always under 185 systolic), then we can try decreasing the lisinopril dose back to 20mg  and monitor closely.

## 2019-07-31 NOTE — Progress Notes (Signed)
Chief Complaint  Patient presents with  . Annual Exam    nonfasting annual exam. No new concerns.     Tyler Martin is a 63 y.o. male who presents for a complete physical.    Last year he reported losing weight using [employer] program, completed in November, 2019.  He still doesn't eat pizza, limits bread and sugary drinks.  He has maintained his weight loss.  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 He agreed to stay on for another 2-3 years.  They won't move until his son settles down (moves every 2-3 years, in [place]).  Hypertensionfollow-up:Hereports compliance in takinglisinopril 93m and HCTZ 12.578mBP's at home have been running101/56 today, 112/69, 116/65, 118/68, 111/57, 101/52 (since July).    Pulse 65-74.  He is asking about possibly lowering dose back to 2074misinopril. BP monitor was verified as accurate in June, 2019. Only rarely feels dizzy when he stands up quickly. Denies headache, cough, edema, muscle cramps, chest pain.Eating bananas.He is trying to limit sodium in his diet. He continues to exercise regularly. He has started traveling again recently for work (and eating out).  Allergies:He istakingsingulairandclaritin (alternates periodically with zyrtec)daily. Allergies are well controlled.(He previously used Flonase, stopped due to getting bumps in his nose, no longer needs it).  Herpes Labialis: Infrequent flare,usually related to travel, sun, stress.Flares with going to air shows and playing golf (being in the sun).hasn't had any recent flares.  Wearing a mask he sometimes feels twinges, but keeping moisturized (chap stick) helps prevent them.  He does not need refills at this time.  Anxiety/insomnia: He now uses 1/2 tablet of alprazolam intermittently to help with sleep, usually related to travel.Last filled #30 in3/2020. Has 6 or 8 pills left, requesting refill.  Fasting glucose was  elevated at 101 at his physical last year, 105 the year prior. A1c's have been normal. Lab Results  Component Value Date   HGBA1C 5.5 07/27/2018   He is eating more salads;Cut back on pasta, breads. Eats "smarter" desserts, more fruits, limiting sugar in beverages.  Diet has remained the same.   Immunization History  Administered Date(s) Administered  . Influenza Split 06/11/2012  . Influenza, High Dose Seasonal PF 07/01/2018  . Influenza,inj,Quad PF,6+ Mos 06/02/2013, 06/02/2014, 06/07/2015, 06/05/2016, 06/30/2017, 07/15/2019  . Tdap 08/30/2010  . Zoster Recombinat (Shingrix) 08/08/2017, 11/07/2017   Last colonoscopy:10/2017, had tubular adenoma, diverticulosis Last PSA: 1.2 in 06/2018 Dentist: twice yearly  Ophtho: yearly  Exercise: Daily; usesweightsand resistance bands at least 3x/week, and walking/jogging 30 minutes at least 5x/week (2-3 miles/week). Golfs frequently(used to bowl, not open due to pandemic)  Past Medical History:  Diagnosis Date  . Allergic rhinitis, cause unspecified    seasonal  . Anxiety state, unspecified    resolved, related to death of nephew  . Diverticulosis 10/13/07   on colonoscopy (Dr. HunBenson [place]. Hemorrhoids 10/13/07  . Herpes labialis    (prev treated by Dr. JonRonnald Rampth Valtrex 1 gm)  . Hypertension 2012    No past surgical history on file.  Social History   Socioeconomic History  . Marital status: Married    Spouse name: Not on file  . Number of children: 2  . Years of education: Not on file  . Highest education level: Not on file  Occupational History  . 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 Financial resource strain: Not on file  .  Food insecurity    Worry: Not on file    Inability: Not on file  . Transportation needs    Medical: Not on file    Non-medical: Not on file  Tobacco Use  . Smoking status: Former Smoker    Packs/day: 1.00    Years: 30.00    Pack  years: 30.00    Types: Cigarettes    Quit date: 09/30/1998    Years since quitting: 20.8  . Smokeless tobacco: Never Used  Substance and Sexual Activity  . Alcohol use: Yes    Comment: 2-3 drinks per week.  . Drug use: No  . Sexual activity: Yes    Partners: Female  Lifestyle  . Physical activity    Days per week: Not on file    Minutes per session: Not on file  . Stress: Not on file  Relationships  . Social Herbalist on phone: Not on file    Gets together: Not on file    Attends religious service: Not on file    Active member of club or organization: Not on file    Attends meetings of clubs or organizations: Not on file    Relationship status: Not on file  . Intimate partner violence    Fear of current or ex partner: Not on file    Emotionally abused: Not on file    Physically abused: Not on file    Forced sexual activity: Not on file  Other Topics Concern  . Not on file  Social History Narrative   Married, 1 cat.  Son is an [employer] MD, currently in [place], [place] for ER residency.  Daughter lives in [place].  2 grandchildren [facility])   Updated 07/2019       Family History  Problem Relation Age of Onset  . Stroke Mother   . Hypertension Mother   . Parkinsonism Mother   . Parkinsonism Father   . Heart disease Father 49       MI  . Hypertension Sister   . Hypertension Sister   . COPD Maternal Grandfather   . Cancer Maternal Uncle        prostate  . Cancer Cousin        prostate  . Diabetes Neg Hx     Outpatient Encounter Medications as of 08/02/2019  Medication Sig Note  . ALPRAZolam (XANAX) 1 MG tablet Take 0.5-1 tablets (0.5-1 mg total) by mouth at bedtime as needed for sleep.   . hydrochlorothiazide (MICROZIDE) 12.5 MG capsule TAKE 1 CAPSULE BY MOUTH EVERY DAY   . lisinopril (PRINIVIL,ZESTRIL) 30 MG tablet Take 1 tablet (30 mg total) by mouth daily.   Marland Kitchen loratadine (CLARITIN) 10 MG tablet Take 10 mg by mouth daily.   . montelukast (SINGULAIR) 10 MG  tablet Take 1 tablet (10 mg total) by mouth at bedtime.   . [DISCONTINUED] ALPRAZolam (XANAX) 1 MG tablet Take 0.5-1 tablets (0.5-1 mg total) by mouth at bedtime as needed for sleep.   . [DISCONTINUED] montelukast (SINGULAIR) 10 MG tablet Take 1 tablet (10 mg total) by mouth at bedtime.   . hydrocortisone 2.5 % ointment APPLY TO AFFECTED AREA TWICE A DAY 07/21/2017: Uses prn rashes  . valACYclovir (VALTREX) 1000 MG tablet TAKE 2 TABLETS AT ONSET OF COLD SORE. REPEAT ONCE IN 12 HOURS (TOTAL OF 4 TABLETS PER COURSE) (Patient not taking: Reported on 08/02/2019)   . [DISCONTINUED] ofloxacin (OCUFLOX) 0.3 % ophthalmic solution USE 1 DROP INTO LEFT EYE 3  TIMES A DAY    No facility-administered encounter medications on file as of 08/02/2019.     No Known Allergies  ROS: The patient denies anorexia, fever, headaches, vision loss, decreased hearing, ear pain, hoarseness, chest pain, palpitations, dizziness, syncope, dyspnea on exertion, cough, swelling, nausea, vomiting, diarrhea, constipation, abdominal pain, melena, hematochezia, indigestion/heartburn, hematuria, incontinence, erectile dysfunction, nocturia, weakened urine stream, dysuria, genital lesions, joint pains, numbness, tingling, weakness, tremor, suspicious skin lesions, depression, abnormal bleeding/bruising, or enlarged lymph nodes. Some intermittent insomnia, usually with travel; xanax helps prn, as per HPI. Sees Dr. Ronnald Ramp regularly (derm). Allergies are controlledon current meds. Rare dizziness with standing, per HPI   PHYSICAL EXAM:  BP 130/70   Pulse 80   Temp (!) 97.3 F (36.3 C) (Other (Comment))   Ht _0  (1.778 m)   Wt 192 lb 12.8 oz (87.5 kg)   BMI 27.66 kg/m   Wt Readings from Last 3 Encounters:  08/02/19 192 lb 12.8 oz (87.5 kg)  11/09/18 192 lb 12.8 oz (87.5 kg)  07/27/18 191 lb 12.8 oz (87 kg)    General Appearance:  Alert, cooperative, no distress, appears stated age   Head:  Normocephalic, without  obvious abnormality, atraumatic   Eyes:  PERRL, conjunctiva/corneas clear, EOM's intact, fundi benign; small cataract noted on the left  Ears:  Normal TM's and external ear canals   Nose:  Not examined, wearing mask due to COVID-19 pandemic  Throat:  Not examined, wearing mask due to COVID-19 pandemic   Neck:  Supple, no lymphadenopathy; thyroid: no enlargement/tenderness/nodules; no carotid bruit or JVD   Back:  Spine nontender, no curvature, ROM normal, no CVA tenderness   Lungs:  Clear to auscultation bilaterally without wheezes, rales or ronchi; respirations unlabored   Chest Wall:  No tenderness or deformity   Heart:  Regular rate and rhythm, S1 and S2 normal, no murmur, rub or gallop   Breast Exam: No chest wall tenderness, masses or gynecomastia   Abdomen:  Soft, non-tender, obese, nondistended, normoactive bowel sounds, no masses, no hepatosplenomegaly.  Genitalia:  Normal male external genitalia without lesions. Testicles without masses; small cyst noted on left, supero-posterior to testicle, nontender.  This is unchanged from last year (poss slightly larger). No inguinal hernias.   Rectal:  Normal sphincter tone, no masses or tenderness; guaiac negative stool. Prostate smooth, no nodules, normal size  Extremities:  No clubbing, cyanosis or edema   Pulses:  2+ and symmetric all extremities   Skin:  Skin color, texture, turgor normal, no rashes or lesions.No suspicious lesions  Lymph nodes:  Cervical, supraclavicular, and axillary nodes normal   Neurologic:  CNII-XII intact, normal strength, sensation and gait; reflexes 2+ and symmetric throughout    Psych:Normal mood, affect, hygiene and grooming, speech and eye contact   ASSESSMENT/PLAN:  Annual physical exam - Plan: POCT Urinalysis DIP (Proadvantage Device), Comprehensive metabolic panel, CBC with Differential/Platelet, Lipid panel, PSA  Essential hypertension, benign - well  controlled, on the low side but asymptomatic. Consider lowering back to 44m lisinopril (and cont 12.5 HCTZ) if BP remains lower when next needs refill - Plan: Comprehensive metabolic panel  Medication monitoring encounter - Plan: Comprehensive metabolic panel  Screening for prostate cancer - Plan: PSA  Impaired fasting glucose - counseled re: diet, exercise, weight - Plan: Comprehensive metabolic panel  Allergic rhinitis due to pollen, unspecified seasonality - well controlled - Plan: montelukast (SINGULAIR) 10 MG tablet  Anxiety state - resolved, mostly now insomnia related to travel - Plan:  ALPRAZolam (XANAX) 1 MG tablet  Overweight (BMI 25.0-29.9) - abdominal obesity.  Encouraged further weight loss (losing inches at waist). Congratulated on maintaining weight loss. diet reviewed   PSA, c-met, lipids, CBC Patient reports he still has about 2 months of the lisinopril and HCTZ. Needs montelukast refill only.  BP's are lower than they need to be. He has maintained his weight loss.  He would like to try lowering the lisinopril back to 68m, but still has about 2-3 months left of the 335mtablets.  Since no dizziness, he will contact usKorealoser to when he needs a refill with updated blood pressures. If still low, we can lower the lisinopril to 204mand keep other meds the same).   Discussed PSA screening (risks/benefits), recommended at least 30 minutes of aerobic activity at least 5 days/week, weight-bearing exercise at least 2x/wk; proper sunscreen use reviewed; healthy diet and alcohol recommendations (less than or equal to 2 drinks/day) reviewed; regular seatbelt use; changing batteries in smoke detectors. Self-testicular exams. Immunization recommendations discussed--continue yearly flu shots. Colonoscopy recommendations reviewed, UTD--likely 5 yr f/u recommended due to adenomatous polyp, 10/2022(pt states he was told 7 yr f/u)  F/u 1 year for CPE, sooner prn

## 2019-08-02 ENCOUNTER — Encounter: Payer: Self-pay | Admitting: Family Medicine

## 2019-08-02 ENCOUNTER — Other Ambulatory Visit: Payer: Self-pay

## 2019-08-02 ENCOUNTER — Ambulatory Visit: Payer: Managed Care, Other (non HMO) | Admitting: Family Medicine

## 2019-08-02 VITALS — BP 130/70 | HR 80 | Temp 97.3°F | Ht 70.0 in | Wt 192.8 lb

## 2019-08-02 DIAGNOSIS — J301 Allergic rhinitis due to pollen: Secondary | ICD-10-CM | POA: Diagnosis not present

## 2019-08-02 DIAGNOSIS — I10 Essential (primary) hypertension: Secondary | ICD-10-CM | POA: Diagnosis not present

## 2019-08-02 DIAGNOSIS — R7301 Impaired fasting glucose: Secondary | ICD-10-CM | POA: Diagnosis not present

## 2019-08-02 DIAGNOSIS — Z5181 Encounter for therapeutic drug level monitoring: Secondary | ICD-10-CM

## 2019-08-02 DIAGNOSIS — Z0000 Encounter for general adult medical examination without abnormal findings: Secondary | ICD-10-CM

## 2019-08-02 DIAGNOSIS — Z125 Encounter for screening for malignant neoplasm of prostate: Secondary | ICD-10-CM

## 2019-08-02 DIAGNOSIS — F411 Generalized anxiety disorder: Secondary | ICD-10-CM

## 2019-08-02 DIAGNOSIS — E663 Overweight: Secondary | ICD-10-CM

## 2019-08-02 LAB — POCT URINALYSIS DIP (PROADVANTAGE DEVICE)
Bilirubin, UA: NEGATIVE
Blood, UA: NEGATIVE
Glucose, UA: NEGATIVE mg/dL
Ketones, POC UA: NEGATIVE mg/dL
Leukocytes, UA: NEGATIVE
Nitrite, UA: NEGATIVE
Protein Ur, POC: NEGATIVE mg/dL
Specific Gravity, Urine: 1.005
Urobilinogen, Ur: NEGATIVE
pH, UA: 8 (ref 5.0–8.0)

## 2019-08-02 MED ORDER — MONTELUKAST SODIUM 10 MG PO TABS: 10.0000 mg | ORAL_TABLET | Freq: Every day | ORAL | 3 refills | Status: DC

## 2019-08-02 MED ORDER — ALPRAZOLAM 1 MG PO TABS: 0.5000 mg | ORAL_TABLET | Freq: Every evening | ORAL | 0 refills | Status: DC | PRN

## 2019-08-03 ENCOUNTER — Encounter: Payer: Self-pay | Admitting: Family Medicine

## 2019-08-03 LAB — COMPREHENSIVE METABOLIC PANEL
ALT: 21 IU/L (ref 0–44)
AST: 20 IU/L (ref 0–40)
Albumin/Globulin Ratio: 1.6 (ref 1.2–2.2)
Albumin: 4.4 g/dL (ref 3.8–4.8)
Alkaline Phosphatase: 82 IU/L (ref 39–117)
BUN/Creatinine Ratio: 16 (ref 10–24)
BUN: 14 mg/dL (ref 8–27)
Bilirubin Total: 0.4 mg/dL (ref 0.0–1.2)
CO2: 25 mmol/L (ref 20–29)
Calcium: 9.5 mg/dL (ref 8.6–10.2)
Chloride: 100 mmol/L (ref 96–106)
Creatinine, Ser: 0.89 mg/dL (ref 0.76–1.27)
GFR calc Af Amer: 106 mL/min/{1.73_m2} (ref >59)
GFR calc non Af Amer: 92 mL/min/{1.73_m2} (ref >59)
Globulin, Total: 2.7 g/dL (ref 1.5–4.5)
Glucose: 108 mg/dL — ABNORMAL HIGH (ref 65–99)
Potassium: 4.4 mmol/L (ref 3.5–5.2)
Sodium: 137 mmol/L (ref 134–144)
Total Protein: 7.1 g/dL (ref 6.0–8.5)

## 2019-08-03 LAB — CBC WITH DIFFERENTIAL/PLATELET
Basophils Absolute: 0.1 10*3/uL (ref 0.0–0.2)
Basos: 1 %
EOS (ABSOLUTE): 0.1 10*3/uL (ref 0.0–0.4)
Eos: 1 %
Hematocrit: 43 % (ref 37.5–51.0)
Hemoglobin: 14.8 g/dL (ref 13.0–17.7)
Immature Grans (Abs): 0 10*3/uL (ref 0.0–0.1)
Immature Granulocytes: 0 %
Lymphocytes Absolute: 2.3 10*3/uL (ref 0.7–3.1)
Lymphs: 36 %
MCH: 29.9 pg (ref 26.6–33.0)
MCHC: 34.4 g/dL (ref 31.5–35.7)
MCV: 87 fL (ref 79–97)
Monocytes Absolute: 0.6 10*3/uL (ref 0.1–0.9)
Monocytes: 10 %
Neutrophils Absolute: 3.2 10*3/uL (ref 1.4–7.0)
Neutrophils: 52 %
Platelets: 272 10*3/uL (ref 150–450)
RBC: 4.95 x10E6/uL (ref 4.14–5.80)
RDW: 11.9 % (ref 11.6–15.4)
WBC: 6.3 10*3/uL (ref 3.4–10.8)

## 2019-08-03 LAB — LIPID PANEL
Chol/HDL Ratio: 3.1 ratio (ref 0.0–5.0)
Cholesterol, Total: 151 mg/dL (ref 100–199)
HDL: 49 mg/dL (ref >39)
LDL Chol Calc (NIH): 82 mg/dL (ref 0–99)
Triglycerides: 110 mg/dL (ref 0–149)
VLDL Cholesterol Cal: 20 mg/dL (ref 5–40)

## 2019-08-03 LAB — PSA: Prostate Specific Ag, Serum: 1 ng/mL (ref 0.0–4.0)

## 2019-09-15 ENCOUNTER — Encounter: Payer: Self-pay | Admitting: Family Medicine

## 2019-09-15 DIAGNOSIS — I10 Essential (primary) hypertension: Secondary | ICD-10-CM

## 2019-09-15 MED ORDER — LISINOPRIL 20 MG PO TABS: 20.0000 mg | ORAL_TABLET | Freq: Every day | ORAL | 1 refills | Status: DC

## 2019-09-15 MED ORDER — HYDROCHLOROTHIAZIDE 12.5 MG PO CAPS: ORAL_CAPSULE | ORAL | 3 refills | Status: DC

## 2019-09-16 ENCOUNTER — Other Ambulatory Visit: Payer: Self-pay | Admitting: Family Medicine

## 2019-09-16 DIAGNOSIS — I10 Essential (primary) hypertension: Secondary | ICD-10-CM

## 2019-09-18 ENCOUNTER — Encounter: Payer: Self-pay | Admitting: Family Medicine

## 2019-10-18 ENCOUNTER — Other Ambulatory Visit: Payer: Self-pay | Admitting: Family Medicine

## 2019-10-18 DIAGNOSIS — B001 Herpesviral vesicular dermatitis: Secondary | ICD-10-CM

## 2019-10-18 MED ORDER — VALACYCLOVIR HCL 1 G PO TABS: ORAL_TABLET | ORAL | 0 refills | Status: DC

## 2019-10-18 NOTE — Telephone Encounter (Signed)
Is this okay to refill? 

## 2019-11-30 ENCOUNTER — Encounter: Payer: Self-pay | Admitting: Family Medicine

## 2019-12-02 ENCOUNTER — Encounter: Payer: Self-pay | Admitting: Family Medicine

## 2019-12-10 ENCOUNTER — Encounter: Payer: Self-pay | Admitting: Family Medicine

## 2019-12-13 ENCOUNTER — Encounter: Payer: Self-pay | Admitting: Emergency Medicine

## 2019-12-13 ENCOUNTER — Ambulatory Visit (INDEPENDENT_AMBULATORY_CARE_PROVIDER_SITE_OTHER): Payer: BC Managed Care – PPO

## 2019-12-13 ENCOUNTER — Other Ambulatory Visit: Payer: Self-pay

## 2019-12-13 ENCOUNTER — Ambulatory Visit
Admission: EM | Admit: 2019-12-13 | Discharge: 2019-12-13 | Disposition: A | Discharge status: Home / Self Care | Payer: BC Managed Care – PPO | Attending: Physician Assistant | Admitting: Physician Assistant

## 2019-12-13 DIAGNOSIS — R509 Fever, unspecified: Secondary | ICD-10-CM | POA: Diagnosis not present

## 2019-12-13 DIAGNOSIS — R05 Cough: Secondary | ICD-10-CM

## 2019-12-13 DIAGNOSIS — U071 COVID-19: Secondary | ICD-10-CM

## 2019-12-13 LAB — POC SARS CORONAVIRUS 2 AG -  ED: SARS Coronavirus 2 Ag: POSITIVE — AB

## 2019-12-13 LAB — POCT INFLUENZA A/B
Influenza A, POC: NEGATIVE
Influenza B, POC: NEGATIVE

## 2019-12-13 NOTE — Discharge Instructions (Addendum)
Rapid COVID positive. Please quarantine at this time. You can discontinue quarantine after 10 days of symptom onset AND 24 hours of no fever with improvement of symptoms. If develop shortness of breath, chest pain, trouble breathing, weakness, dizziness, go to the emergency department for further evaluation needed.   Can alternate tylenol 1000mg  and ibuprofen 800mg 

## 2019-12-13 NOTE — ED Notes (Signed)
Patient able to ambulate independently  

## 2019-12-13 NOTE — ED Triage Notes (Addendum)
Pt presents to [facility] for assessment of chills starting on Thursday, 104 temp Friday afternoon.  Made his PCP aware.  Patient has been taking Tylenol and Advil every 8 hours since.  Pt c/o "slight cough", clear phlegm, sore throat, off and  stuffy nose.  Denies n/v/d.  Denies headaches, denies body aches.  104 temp on waking prior to arrival, took ibuprofen and tylenol at 8am.

## 2019-12-13 NOTE — ED Provider Notes (Signed)
[facility]    CSN: [id number] Arrival date & time: 12/13/19  0851      History   Chief Complaint Chief Complaint  Patient presents with  . Flu-Like Symptoms    HPI DAIRON PROCTER is a 64 y.o. male.   64 year old male with history of HTN comes in for 4 day of fever, chills, body aches. Mild cough, sore throat, intermittent nasal congestion. tmax 104, responsive to antipyretic.  Denies abdominal pain, nausea, vomiting, diarrhea. Denies urinary symptoms such as frequency, dysuria, hematuria. Denies shortness of breath, loss of taste/smell. Denies weakness, dizziness, syncope. First moderna vaccine 12/02/2019     Past Medical History:  Diagnosis Date  . Allergic rhinitis, cause unspecified    seasonal  . Anxiety state, unspecified    resolved, related to death of nephew  . Diverticulosis 10/13/07   on colonoscopy (Dr. Elnoria Howard)  . Hemorrhoids 10/13/07  . Herpes labialis    (prev treated by Dr. Yetta Barre with Valtrex 1 gm)  . Hypertension 2012    Patient Active Problem List   Diagnosis Date Noted  . Allergic rhinitis 06/02/2013  . Herpes labialis 06/02/2013  . Anxiety state, unspecified 06/02/2013  . Essential hypertension, benign 12/18/2011  . Allergic rhinitis, cause unspecified 12/18/2011    History reviewed. No pertinent surgical history.     Home Medications    Prior to Admission medications   Medication Sig Start Date End Date Taking? Authorizing Provider  ALPRAZolam Prudy Feeler) 1 MG tablet Take 0.5-1 tablets (0.5-1 mg total) by mouth at bedtime as needed for sleep. 08/02/19   Joselyn Arrow, MD  hydrochlorothiazide (MICROZIDE) 12.5 MG capsule TAKE 1 CAPSULE BY MOUTH EVERY DAY 09/15/19   Joselyn Arrow, MD  hydrocortisone 2.5 % ointment APPLY TO AFFECTED AREA TWICE A DAY 05/07/17   [provider]  lisinopril (ZESTRIL) 20 MG tablet Take 1 tablet (20 mg total) by mouth daily. 09/15/19   Joselyn Arrow, MD  loratadine (CLARITIN) 10 MG tablet Take 10 mg by mouth  daily.    [provider]  montelukast (SINGULAIR) 10 MG tablet Take 1 tablet (10 mg total) by mouth at bedtime. 08/02/19   Joselyn Arrow, MD  valACYclovir (VALTREX) 1000 MG tablet TAKE 2 TABLETS AT ONSET OF COLD SORE. REPEAT ONCE IN 12 HOURS (TOTAL OF 4 TABLETS PER COURSE) 10/18/19   Joselyn Arrow, MD    Family History Family History  Problem Relation Age of Onset  . Stroke Mother   . Hypertension Mother   . Parkinsonism Mother   . Parkinsonism Father   . Heart disease Father 39       MI  . Hypertension Sister   . Hypertension Sister   . COPD Maternal Grandfather   . Cancer Maternal Uncle        prostate  . Cancer Cousin        prostate  . Diabetes Neg Hx     Social History Social History   Tobacco Use  . Smoking status: Former Smoker    Packs/day: 1.00    Years: 30.00    Pack years: 30.00    Types: Cigarettes    Quit date: 09/30/1998    Years since quitting: 21.2  . Smokeless tobacco: Never Used  Substance Use Topics  . Alcohol use: Yes    Comment: 2-3 drinks per week.  . Drug use: No     Allergies   Patient has no known allergies.   Review of Systems Review of Systems  Reason unable to perform ROS: See HPI as above.     Physical Exam Triage Vital Signs ED Triage Vitals  Enc Vitals Group     BP 12/13/19 0900 128/75     Pulse Rate 12/13/19 0900 95     Resp 12/13/19 0900 16     Temp 12/13/19 0900 98.1 F (36.7 C)     Temp Source 12/13/19 0900 Temporal     SpO2 12/13/19 0900 95 %     Weight --      Height --      Head Circumference --      Peak Flow --      Pain Score 12/13/19 0903 0     Pain Loc --      Pain Edu? --      Excl. in GC? --    No data found.  Updated Vital Signs BP 128/75 (BP Location: Left Arm)   Pulse 95   Temp 98.1 F (36.7 C) (Temporal) Comment: Tylenol and Advil at 8am  Resp 16   SpO2 95%   Physical Exam Constitutional:      General: He is not in acute distress.    Appearance: Normal appearance. He is not  ill-appearing, toxic-appearing or diaphoretic.  HENT:     Head: Normocephalic and atraumatic.     Mouth/Throat:     Mouth: Mucous membranes are moist.     Pharynx: Oropharynx is clear. Uvula midline.  Cardiovascular:     Rate and Rhythm: Normal rate and regular rhythm.     Heart sounds: Normal heart sounds. No murmur. No friction rub. No gallop.   Pulmonary:     Effort: Pulmonary effort is normal. No accessory muscle usage, prolonged expiration, respiratory distress or retractions.     Comments: Lungs clear to auscultation without adventitious lung sounds. Abdominal:     General: Bowel sounds are normal.     Palpations: Abdomen is soft.     Tenderness: There is no abdominal tenderness. There is no guarding or rebound.  Musculoskeletal:     Cervical back: Normal range of motion and neck supple.  Neurological:     General: No focal deficit present.     Mental Status: He is alert and oriented to person, place, and time.      UC Treatments / Results  Labs (all labs ordered are listed, but only abnormal results are displayed) Labs Reviewed  POC SARS CORONAVIRUS 2 AG -  ED - Abnormal; Notable for the following components:      Result Value   SARS Coronavirus 2 Ag Positive (*)    All other components within normal limits  POCT INFLUENZA A/B    EKG   Radiology DG Chest 2 View  Result Date: 12/13/2019 CLINICAL DATA:  Cough, fever EXAM: CHEST - 2 VIEW COMPARISON:  None. FINDINGS: Heart and mediastinal contours are within normal limits. No focal opacities or effusions. No acute bony abnormality. IMPRESSION: No active cardiopulmonary disease. Electronically Signed   By: Charlett Nose M.D.   On: 12/13/2019 09:32    Procedures Procedures (including critical care time)  Medications Ordered in UC Medications - No data to display  Initial Impression / Assessment and Plan / UC Course  I have reviewed the triage vital signs and the nursing notes.  Pertinent labs & imaging results  that were available during my care of the patient were reviewed by me and considered in my medical decision making (see chart for details).    64 year old male  with history of HTN comes in for 4-day history of fever, chills, body aches.  Has had mild URI symptoms.  Fever with T-max of 104, responsive to antipyretic.  Denies abdominal symptoms, urinary symptoms.  He denies chest pain, shortness of breath.  Denies weakness, dizziness.  First maternal shot 12/02/2019.  He is afebrile in clinic with antipyretic.  Without tachycardia, tachypnea, hypoxia.  He is speaking in full sentences without difficulty.  Lungs clear to auscultation bilaterally without adventitious lung sounds.  Given significant fever, will add chest x-ray and UA for further work-up.  Will obtain rapid Covid and rapid flu.  Rapid flu negative.  Rapid Covid positive.  Chest x-ray without active cardiopulmonary disease.  Patient has been unable to provide urine sample, given positive Covid, no urinary symptoms, will defer UA at this time.  Discussed quarantine instructions.  Continue symptomatic treatment.  Push fluids.  Return precautions given.  Patient expresses understanding and agrees to plan.  Given patient >28 year old, with hypertension, left message for infusion hotline for referral.  Final Clinical Impressions(s) / UC Diagnoses   Final diagnoses:  [id number]   ED Prescriptions    None     PDMP not reviewed this encounter.   Ok Edwards, PA-C 12/13/19 1003

## 2019-12-14 ENCOUNTER — Telehealth: Payer: Self-pay | Admitting: Nurse Practitioner

## 2019-12-14 ENCOUNTER — Other Ambulatory Visit: Payer: Self-pay | Admitting: Nurse Practitioner

## 2019-12-14 ENCOUNTER — Telehealth: Payer: Self-pay | Admitting: Family Medicine

## 2019-12-14 DIAGNOSIS — U071 COVID-19: Secondary | ICD-10-CM

## 2019-12-14 DIAGNOSIS — I10 Essential (primary) hypertension: Secondary | ICD-10-CM

## 2019-12-14 MED ORDER — SODIUM CHLORIDE 0.9 % IV SOLN
700.0000 mg | Freq: Once | INTRAVENOUS | Status: AC
  Administered 2019-12-15: 09:00:00 700 mg via INTRAVENOUS
  Filled 2019-12-14: qty 700

## 2019-12-14 NOTE — Progress Notes (Signed)
  I connected by phone with Tyler Martin on 12/14/2019 at 8:43 AM to discuss the potential use of an new treatment for mild to moderate COVID-19 viral infection in non-hospitalized patients.  This patient is a 64 y.o. male that meets the FDA criteria for Emergency Use Authorization of bamlanivimab or casirivimab\imdevimab.  Has a (+) direct SARS-CoV-2 viral test result  Has mild or moderate COVID-19   Is ? 63 years of age and weighs ? 40 kg  Is NOT hospitalized due to COVID-19  Is NOT requiring oxygen therapy or requiring an increase in baseline oxygen flow rate due to COVID-19  Is within 10 days of symptom onset  Has at least one of the high risk factor(s) for progression to severe COVID-19 and/or hospitalization as defined in EUA.  Specific high risk criteria : Hypertension   I have spoken and communicated the following to the patient or parent/caregiver:  1. FDA has authorized the emergency use of bamlanivimab and casirivimab\imdevimab for the treatment of mild to moderate COVID-19 in adults and pediatric patients with positive results of direct SARS-CoV-2 viral testing who are 77 years of age and older weighing at least 40 kg, and who are at high risk for progressing to severe COVID-19 and/or hospitalization.  2. The significant known and potential risks and benefits of bamlanivimab and casirivimab\imdevimab, and the extent to which such potential risks and benefits are unknown.  3. Information on available alternative treatments and the risks and benefits of those alternatives, including clinical trials.  4. Patients treated with bamlanivimab and casirivimab\imdevimab should continue to self-isolate and use infection control measures (e.g., wear mask, isolate, social distance, avoid sharing personal items, clean and disinfect "high touch" surfaces, and frequent handwashing) according to CDC guidelines.   5. The patient or parent/caregiver has the option to accept or refuse  bamlanivimab or casirivimab\imdevimab .  After reviewing this information with the patient, The patient agreed to proceed with receiving the bamlanimivab infusion and will be provided a copy of the Fact sheet prior to receiving the infusion.[name] 12/14/2019 8:43 AM

## 2019-12-14 NOTE — Telephone Encounter (Signed)
Spoke with pt about monoclonal antibody treatments (recommended it). Questions answered.  Still febrile and a little more cough.

## 2019-12-14 NOTE — Telephone Encounter (Signed)
Called to Discuss with patient about Covid symptoms and the use of bamlanivimab, a monoclonal antibody infusion for those with mild to moderate Covid symptoms and at a high risk of hospitalization.     Pt is qualified for this infusion at the [facility] infusion center due to co-morbid conditions and/or a member of an at-risk group.     Patient Active Problem List   Diagnosis Date Noted  . Allergic rhinitis 06/02/2013  . Herpes labialis 06/02/2013  . Anxiety state, unspecified 06/02/2013  . Essential hypertension, benign 12/18/2011  . Allergic rhinitis, cause unspecified 12/18/2011    Patient wants to get OK from PCP first and will call back to confirm appointment. Patient is tentatively scheduled for 8:00 am tomorrow 12/15/19. Symptoms tier reviewed as well as criteria for ending isolation. Preventative practices reviewed. Patient verbalized understanding.    Symptoms started 12/10/19

## 2019-12-14 NOTE — Telephone Encounter (Signed)
Pt called and states that he has tested positive for COVID.  It's day four and he is experiencing fever and chills. It is controlled with medication. Pt states [place] [facility] is advising Monoclonal Antibodies Infusion. He would like your opinion on that treatment. Please advise pt at [phone].

## 2019-12-15 ENCOUNTER — Other Ambulatory Visit: Payer: Self-pay | Admitting: Family Medicine

## 2019-12-15 ENCOUNTER — Ambulatory Visit (HOSPITAL_COMMUNITY)
Admission: RE | Admit: 2019-12-15 | Discharge: 2019-12-15 | Disposition: A | Discharge status: Home / Self Care | Payer: BC Managed Care – PPO | Source: Ambulatory Visit | Attending: Pulmonary Disease | Admitting: Pulmonary Disease

## 2019-12-15 DIAGNOSIS — I10 Essential (primary) hypertension: Secondary | ICD-10-CM | POA: Insufficient documentation

## 2019-12-15 DIAGNOSIS — U071 COVID-19: Secondary | ICD-10-CM | POA: Diagnosis not present

## 2019-12-15 MED ORDER — DIPHENHYDRAMINE HCL 50 MG/ML IJ SOLN: 50.0000 mg | Freq: Once | INTRAMUSCULAR | Status: DC | PRN

## 2019-12-15 MED ORDER — EPINEPHRINE 0.3 MG/0.3ML IJ SOAJ: 0.3000 mg | Freq: Once | INTRAMUSCULAR | Status: DC | PRN

## 2019-12-15 MED ORDER — ALBUTEROL SULFATE HFA 108 (90 BASE) MCG/ACT IN AERS: 2.0000 | INHALATION_SPRAY | Freq: Once | RESPIRATORY_TRACT | Status: DC | PRN

## 2019-12-15 MED ORDER — FAMOTIDINE IN NACL 20-0.9 MG/50ML-% IV SOLN: 20.0000 mg | Freq: Once | INTRAVENOUS | Status: DC | PRN

## 2019-12-15 MED ORDER — SODIUM CHLORIDE 0.9 % IV SOLN: INTRAVENOUS | Status: DC | PRN

## 2019-12-15 MED ORDER — METHYLPREDNISOLONE SODIUM SUCC 125 MG IJ SOLR: 125.0000 mg | Freq: Once | INTRAMUSCULAR | Status: DC | PRN

## 2019-12-15 NOTE — Discharge Instructions (Signed)

## 2019-12-15 NOTE — Progress Notes (Signed)
  Diagnosis: COVID-19  Physician: Dr. Wright  Procedure: Covid Infusion Clinic Med: bamlanivimab infusion - Provided patient with bamlanimivab fact sheet for patients, parents and caregivers prior to infusion.  Complications: No immediate complications noted.  Discharge: Discharged home   Kieryn Burtis S Laverna Dossett 12/15/2019  

## 2019-12-16 ENCOUNTER — Encounter (INDEPENDENT_AMBULATORY_CARE_PROVIDER_SITE_OTHER): Payer: Self-pay

## 2019-12-17 ENCOUNTER — Encounter (INDEPENDENT_AMBULATORY_CARE_PROVIDER_SITE_OTHER): Payer: Self-pay

## 2019-12-18 ENCOUNTER — Encounter (INDEPENDENT_AMBULATORY_CARE_PROVIDER_SITE_OTHER): Payer: Self-pay

## 2019-12-18 MED ORDER — BENZONATATE 200 MG PO CAPS: 200.0000 mg | ORAL_CAPSULE | Freq: Three times a day (TID) | ORAL | 0 refills | Status: DC | PRN

## 2019-12-20 ENCOUNTER — Encounter (INDEPENDENT_AMBULATORY_CARE_PROVIDER_SITE_OTHER): Payer: Self-pay

## 2019-12-21 ENCOUNTER — Telehealth: Payer: Self-pay | Admitting: *Deleted

## 2019-12-21 ENCOUNTER — Encounter: Payer: Self-pay | Admitting: *Deleted

## 2019-12-21 ENCOUNTER — Encounter (INDEPENDENT_AMBULATORY_CARE_PROVIDER_SITE_OTHER): Payer: Self-pay

## 2019-12-21 NOTE — Telephone Encounter (Signed)
Pt called back and states his cough is not new; he says it is unchanged and he has talked with Dr Lynelle Doctor about this; he says that the tessalon perles did help initially; pt advised to consider OTC cough syrup and,contact his PCP to discuss symptoms; he verbalized understanding.

## 2019-12-21 NOTE — Telephone Encounter (Signed)
Received My[facility] BPA on 3//23/21 due to pt reporting new cough; attempted to contact pt to discuss symptoms; left message on voicemail.

## 2019-12-22 ENCOUNTER — Encounter (INDEPENDENT_AMBULATORY_CARE_PROVIDER_SITE_OTHER): Payer: Self-pay

## 2019-12-23 ENCOUNTER — Encounter (INDEPENDENT_AMBULATORY_CARE_PROVIDER_SITE_OTHER): Payer: Self-pay

## 2019-12-24 ENCOUNTER — Encounter (INDEPENDENT_AMBULATORY_CARE_PROVIDER_SITE_OTHER): Payer: Self-pay

## 2019-12-25 ENCOUNTER — Encounter (INDEPENDENT_AMBULATORY_CARE_PROVIDER_SITE_OTHER): Payer: Self-pay

## 2019-12-26 ENCOUNTER — Encounter (INDEPENDENT_AMBULATORY_CARE_PROVIDER_SITE_OTHER): Payer: Self-pay

## 2019-12-27 ENCOUNTER — Encounter (INDEPENDENT_AMBULATORY_CARE_PROVIDER_SITE_OTHER): Payer: Self-pay

## 2019-12-27 DIAGNOSIS — Z20828 Contact with and (suspected) exposure to other viral communicable diseases: Secondary | ICD-10-CM | POA: Diagnosis not present

## 2019-12-27 DIAGNOSIS — Z03818 Encounter for observation for suspected exposure to other biological agents ruled out: Secondary | ICD-10-CM | POA: Diagnosis not present

## 2019-12-28 ENCOUNTER — Encounter (INDEPENDENT_AMBULATORY_CARE_PROVIDER_SITE_OTHER): Payer: Self-pay

## 2020-03-10 ENCOUNTER — Telehealth: Payer: Self-pay | Admitting: Family Medicine

## 2020-03-10 DIAGNOSIS — I10 Essential (primary) hypertension: Secondary | ICD-10-CM

## 2020-03-10 NOTE — Telephone Encounter (Signed)
No.  Per last CPE, f/u 1 year, sooner prn

## 2020-03-10 NOTE — Telephone Encounter (Signed)
Should this pt have another appt before November? Please advise [facility]

## 2020-03-31 DIAGNOSIS — H5203 Hypermetropia, bilateral: Secondary | ICD-10-CM | POA: Diagnosis not present

## 2020-03-31 DIAGNOSIS — H2513 Age-related nuclear cataract, bilateral: Secondary | ICD-10-CM | POA: Diagnosis not present

## 2020-04-20 DIAGNOSIS — S0502XA Injury of conjunctiva and corneal abrasion without foreign body, left eye, initial encounter: Secondary | ICD-10-CM | POA: Diagnosis not present

## 2020-04-20 DIAGNOSIS — H16202 Unspecified keratoconjunctivitis, left eye: Secondary | ICD-10-CM | POA: Diagnosis not present

## 2020-05-01 ENCOUNTER — Encounter: Payer: Self-pay | Admitting: Family Medicine

## 2020-05-02 ENCOUNTER — Other Ambulatory Visit: Payer: Self-pay | Admitting: Medical

## 2020-05-02 DIAGNOSIS — F411 Generalized anxiety disorder: Secondary | ICD-10-CM

## 2020-05-02 MED ORDER — ALPRAZOLAM 1 MG PO TABS: 0.5000 mg | ORAL_TABLET | Freq: Every evening | ORAL | 0 refills | Status: DC | PRN

## 2020-05-02 NOTE — Telephone Encounter (Signed)
Is it ok to refill xanax

## 2020-05-30 ENCOUNTER — Other Ambulatory Visit: Payer: Self-pay | Admitting: Family Medicine

## 2020-05-30 DIAGNOSIS — I10 Essential (primary) hypertension: Secondary | ICD-10-CM

## 2020-05-30 DIAGNOSIS — J301 Allergic rhinitis due to pollen: Secondary | ICD-10-CM

## 2020-05-30 NOTE — Telephone Encounter (Signed)
Has an appt end of november 

## 2020-07-10 ENCOUNTER — Encounter: Payer: Self-pay | Admitting: Family Medicine

## 2020-07-11 ENCOUNTER — Other Ambulatory Visit: Payer: Self-pay

## 2020-07-11 ENCOUNTER — Other Ambulatory Visit (INDEPENDENT_AMBULATORY_CARE_PROVIDER_SITE_OTHER): Payer: BC Managed Care – PPO

## 2020-07-11 DIAGNOSIS — Z23 Encounter for immunization: Secondary | ICD-10-CM | POA: Diagnosis not present

## 2020-08-16 ENCOUNTER — Other Ambulatory Visit: Payer: Self-pay | Admitting: Family Medicine

## 2020-08-16 DIAGNOSIS — I10 Essential (primary) hypertension: Secondary | ICD-10-CM

## 2020-08-16 DIAGNOSIS — B001 Herpesviral vesicular dermatitis: Secondary | ICD-10-CM

## 2020-08-16 DIAGNOSIS — J301 Allergic rhinitis due to pollen: Secondary | ICD-10-CM

## 2020-08-17 NOTE — Telephone Encounter (Signed)
Patient would like a refill on this medication, I did call and he did request.

## 2020-08-26 NOTE — Progress Notes (Signed)
Chief Complaint  Patient presents with  . Annual Exam    fasting annual exam, no concerns.    Tyler Martin is a 64 y.o. male who presents for a complete physical and follow-up on chronic problems.    He had COVID back in March, treated with monoclonal antibodies, with complete recovery. He has noticed some ringing in the R ear recently.  He denies any hearing loss or pain. Asking whether booster is needed.  He added taking baby aspirin daily since COVID hit.  He has maintained most of the weight he lost through [place] program (completed in 07/2018). Some recent gain since traveling more for work, followed by the holidays  Hypertensionfollow-up:Lisinopril dose was lowered from 30 to 20mg  at his physical last year, when BP's were consistently on the lower side (100-<120/50-60's). He continues on HCTZ.   BP's have been running 128-133/70's  (max 136/74 at home).  Monitors at home ,plus when donates blood, always "in the green". Has noticed just slight dizziness when he gets up off the couch after prolonged sitting.  Thinks he isn't drinking enough water. Denies headache, cough, edema, muscle cramps, chest pain.He is trying to limit sodium in his diet. He continues to exercise regularly.  Allergies:He istakingsingulairandclaritin.  Uses some benadryl recently as well, related to the leaves flaring his allergies.(He previously used Flonase, stopped due to getting bumps in his nose, and hadn't been needing it.  Herpes Labialis: Infrequent flare,usually related to travel, sun, stress.Flares with going to air shows and playing golf (being in the sun). He had 2 episodes last month, had been traveling to [place], picked up a refill recently.  Starts Valtrex as soon as he feels it coming on, and it responds well.  Anxiety/insomnia: He uses 1/2 tablet of alprazolam intermittently to help with sleep, usually related to travel.Last filled #30 in8/2021.  He doesn't use the alprazolam  at home.  He has been taking melatonin around 9pm, going to bed earlier (before 11).  Recently he has been waking up at 2:30-4:40 in the morning, is able to get back to sleep, though not immediately.  He has been traveling more, and having some more alcohol.  Isn't getting up to use the bathroom (not the cause of his awaking).  Impaired fasting glucose.  Last fasting glucose was 108 08/2019.  Prior years have been 101, 105.  A1c's have all been normal. He tries to limit sugar in beverages, cut back on pasta, breads, limits sweets, eating more salads.  Related to the holiday and travel, diet hasn't been quite as good recently.  Immunization History  Administered Date(s) Administered  . Influenza Split 06/11/2012  . Influenza, High Dose Seasonal PF 07/01/2018  . Influenza,inj,Quad PF,6+ Mos 06/02/2013, 06/02/2014, 06/07/2015, 06/05/2016, 06/30/2017, 07/15/2019, 07/11/2020  . Moderna SARS-COVID-2 Vaccination 12/02/2019, 05/08/2020  . Tdap 08/30/2010  . Zoster Recombinat (Shingrix) 08/08/2017, 11/07/2017   Last colonoscopy:10/2017, had tubular adenoma, diverticulosis Last PSA: Lab Results  Component Value Date   PSA1 1.0 08/02/2019   PSA1 1.2 07/27/2018   PSA 0.7 07/21/2017   PSA 0.6 06/05/2016   PSA 1.61 06/02/2014   Dentist: twice yearly  Ophtho: yearly  Exercise: Daily; usesweightsand resistance bands at least 3x/week, and walking/jogging, and jumproping 30 minutes at least 5x/week (2-3 miles/week). Golfs frequently (walks the course, rides during tournaments).   PMH, PSH, SH and FH were reviewed and updated.  Outpatient Encounter Medications as of 08/28/2020  Medication Sig Note  . aspirin EC 81 MG tablet Take 81  mg by mouth daily. Swallow whole.   . hydrochlorothiazide (MICROZIDE) 12.5 MG capsule TAKE 1 CAPSULE BY MOUTH EVERY DAY   . lisinopril (ZESTRIL) 20 MG tablet Take 1 tablet (20 mg total) by mouth daily.   Marland Kitchen loratadine (CLARITIN) 10 MG tablet Take 10 mg by mouth  daily.   . montelukast (SINGULAIR) 10 MG tablet TAKE 1 TABLET BY MOUTH EVERYDAY AT BEDTIME   . Multiple Vitamins-Minerals (MULTIVITAMIN WITH MINERALS) tablet Take 1 tablet by mouth daily.   . [DISCONTINUED] hydrochlorothiazide (MICROZIDE) 12.5 MG capsule TAKE 1 CAPSULE BY MOUTH EVERY DAY   . [DISCONTINUED] lisinopril (ZESTRIL) 20 MG tablet TAKE 1 TABLET BY MOUTH EVERY DAY   . [DISCONTINUED] montelukast (SINGULAIR) 10 MG tablet TAKE 1 TABLET BY MOUTH EVERYDAY AT BEDTIME   . ALPRAZolam (XANAX) 1 MG tablet Take 0.5-1 tablets (0.5-1 mg total) by mouth at bedtime as needed for sleep. (Patient not taking: Reported on 08/28/2020)   . hydrocortisone 2.5 % ointment APPLY TO AFFECTED AREA TWICE A DAY (Patient not taking: Reported on 08/28/2020) 07/21/2017: Uses prn rashes  . valACYclovir (VALTREX) 1000 MG tablet TAKE 2 TABLETS AT ONSET OF COLD SORE. REPEAT ONCE IN 12 HOURS (TOTAL OF 4 TABLETS PER COURSE) (Patient not taking: Reported on 08/28/2020)   . [DISCONTINUED] benzonatate (TESSALON) 200 MG capsule Take 1 capsule (200 mg total) by mouth 3 (three) times daily as needed for cough.    No facility-administered encounter medications on file as of 08/28/2020.   No Known Allergies   ROS: The patient denies anorexia, fever, headaches, vision loss, decreased hearing, ear pain, hoarseness, chest pain, palpitations, dizziness, syncope, dyspnea on exertion, cough, swelling, nausea, vomiting, diarrhea, constipation, abdominal pain, melena, hematochezia, indigestion/heartburn, hematuria, incontinence, erectile dysfunction, nocturia, weakened urine stream, dysuria, genital lesions, joint pains, numbness, tingling, weakness, tremor, suspicious skin lesions, depression, abnormal bleeding/bruising, or enlarged lymph nodes.  Some intermittent insomnia, see HPI. Sees Dr. Yetta Barre regularly (derm). Allergies are controlledon current meds, though needing some benadryl also recently. Some ringing in the R  ear   PHYSICAL EXAM:  BP 138/80   Pulse 80   Ht 5' 9.5" (1.765 m)   Wt 198 lb 3.2 oz (89.9 kg)   BMI 28.85 kg/m   134/86 on repeat by MD  Wt Readings from Last 3 Encounters:  08/28/20 198 lb 3.2 oz (89.9 kg)  08/02/19 192 lb 12.8 oz (87.5 kg)  11/09/18 192 lb 12.8 oz (87.5 kg)    General Appearance:  Alert, cooperative, no distress, appears stated age   Head:  Normocephalic, without obvious abnormality, atraumatic   Eyes:  PERRL, conjunctiva/corneas clear, EOM's intact, fundi benign  Ears:  Normal TM's and external ear canals   Nose:  Not examined, wearing mask due to COVID-19 pandemic  Throat:  Not examined, wearing mask due to COVID-19 pandemic   Neck:  Supple, no lymphadenopathy; thyroid: no enlargement/tenderness/nodules; no carotid bruit or JVD   Back:  Spine nontender, no curvature, ROM normal, no CVA tenderness   Lungs:  Clear to auscultation bilaterally without wheezes, rales or ronchi; respirations unlabored   Chest Wall:  No tenderness or deformity   Heart:  Regular rate and rhythm, S1 and S2 normal, no murmur, rub or gallop   Breast Exam: No chest wall tenderness, masses or gynecomastia   Abdomen:  Soft, non-tender, obese, nondistended, normoactive bowel sounds, no masses, no hepatosplenomegaly.  Genitalia:  Normal male external genitalia without lesions. Testicles without masses. No inguinal hernias.   Rectal:  Normal  sphincter tone, no masses or tenderness; guaiac negative stool. Prostate smooth, no nodules, normal size  Extremities:  No clubbing, cyanosis or edema   Pulses:  2+ and symmetric all extremities   Skin:  Skin color, texture, turgor normal, no rashes or lesions.No suspicious lesions  Lymph nodes:  Cervical, supraclavicular, and inguinal nodes normal   Neurologic:  CNII-XII intact, normal strength, sensation and gait; reflexes 2+ and symmetric throughout    Psych:Normal mood, affect, hygiene and  grooming, speech and eye contact   ASSESSMENT/PLAN:  Annual physical exam - Plan: Hemoglobin A1c, CBC with Differential/Platelet, Comprehensive metabolic panel, Lipid panel, PSA, POCT Urinalysis DIP (Proadvantage Device)  Impaired fasting glucose - reviewed low carb diet, daily exercise, wt loss - Plan: Hemoglobin A1c, Comprehensive metabolic panel  Screening for prostate cancer - Plan: PSA  Medication monitoring encounter - Plan: Comprehensive metabolic panel  Herpes labialis - infrequent. cont valtrex prn  Need for Tdap vaccination - Plan: Tdap vaccine greater than or equal to 7yo IM  Essential hypertension, benign - Cont current meds, some borderline values. Low Na diet, daily exercise, wt loss - Plan: Comprehensive metabolic panel, hydrochlorothiazide (MICROZIDE) 12.5 MG capsule, lisinopril (ZESTRIL) 20 MG tablet  Allergic rhinitis due to pollen, unspecified seasonality - Cont antihistamine, montelukast. Consider restarting nasal steroid if still requiring benadryl. Avoidance measures reviewed - Plan: montelukast (SINGULAIR) 10 MG tablet  Insomnia, unspecified type - recent issues with early morning wakening. To limit caffeine later in the day and cut back on alcohol. Cont melatonin  Tinnitus of right ear - normal exam, reassured. If worsening sx or any hearing loss, rec audiologic eval    Discussed PSA screening (risks/benefits), recommended at least 30 minutes of aerobic activity at least 5 days/week, weight-bearing exercise at least 2x/wk; proper sunscreen use reviewed; healthy diet and alcohol recommendations (less than or equal to 2 drinks/day) reviewed; regular seatbelt use; changing batteries in smoke detectors. Immunization recommendations discussed--continue yearly flu shots. TdaP given today. COVID booster in February 2022.  Colonoscopy recommendations reviewed, UTD.   F/u 1 year for CPE, sooner prn based on lab results (may need 6 month med check if A1c up).

## 2020-08-26 NOTE — Patient Instructions (Addendum)
  HEALTH MAINTENANCE RECOMMENDATIONS:  It is recommended that you get at least 30 minutes of aerobic exercise at least 5 days/week (for weight loss, you may need as much as 60-90 minutes). This can be any activity that gets your heart rate up. This can be divided in 10-15 minute intervals if needed, but try and build up your endurance at least once a week.  Weight bearing exercise is also recommended twice weekly.  Eat a healthy diet with lots of vegetables, fruits and fiber.  "Colorful" foods have a lot of vitamins (ie green vegetables, tomatoes, red peppers, etc).  Limit sweet tea, regular sodas and alcoholic beverages, all of which has a lot of calories and sugar.  Up to 2 alcoholic drinks daily may be beneficial for men (unless trying to lose weight, watch sugars).  Drink a lot of water.  Sunscreen of at least SPF 30 should be used on all sun-exposed parts of the skin when outside between the hours of 10 am and 4 pm (not just when at beach or pool, but even with exercise, golf, tennis, and yard work!)  Use a sunscreen that says "broad spectrum" so it covers both UVA and UVB rays, and make sure to reapply every 1-2 hours.  Remember to change the batteries in your smoke detectors when changing your clock times in the spring and fall.  Carbon monoxide detectors are recommended for your home.  Use your seat belt every time you are in a car, and please drive safely and not be distracted with cell phones and texting while driving.  Limit caffeine in the afternoon/evenings.  Cutting back on acohol should also help prevent interruptions in your sleep.  If this isn't getting better, let me know.  Based on recent guidelines, you do not need to be taking a daily aspirin.  Consider trying a different nasal steroid spray (ie Rhinocort or Nasacort), versus switching up your antihistamine rather than adding Benadryl to control your allergies.  Wearing a mask when outside might also help (doing yardwork)

## 2020-08-28 ENCOUNTER — Ambulatory Visit (INDEPENDENT_AMBULATORY_CARE_PROVIDER_SITE_OTHER): Payer: BC Managed Care – PPO | Admitting: Family Medicine

## 2020-08-28 ENCOUNTER — Encounter: Payer: Self-pay | Admitting: Family Medicine

## 2020-08-28 ENCOUNTER — Other Ambulatory Visit: Payer: Self-pay

## 2020-08-28 VITALS — BP 138/80 | HR 80 | Ht 69.5 in | Wt 198.2 lb

## 2020-08-28 DIAGNOSIS — Z23 Encounter for immunization: Secondary | ICD-10-CM

## 2020-08-28 DIAGNOSIS — B001 Herpesviral vesicular dermatitis: Secondary | ICD-10-CM | POA: Diagnosis not present

## 2020-08-28 DIAGNOSIS — I10 Essential (primary) hypertension: Secondary | ICD-10-CM

## 2020-08-28 DIAGNOSIS — Z5181 Encounter for therapeutic drug level monitoring: Secondary | ICD-10-CM | POA: Diagnosis not present

## 2020-08-28 DIAGNOSIS — R7301 Impaired fasting glucose: Secondary | ICD-10-CM | POA: Diagnosis not present

## 2020-08-28 DIAGNOSIS — H9311 Tinnitus, right ear: Secondary | ICD-10-CM

## 2020-08-28 DIAGNOSIS — Z125 Encounter for screening for malignant neoplasm of prostate: Secondary | ICD-10-CM | POA: Diagnosis not present

## 2020-08-28 DIAGNOSIS — J301 Allergic rhinitis due to pollen: Secondary | ICD-10-CM

## 2020-08-28 DIAGNOSIS — Z0000 Encounter for general adult medical examination without abnormal findings: Secondary | ICD-10-CM

## 2020-08-28 DIAGNOSIS — G4700 Insomnia, unspecified: Secondary | ICD-10-CM

## 2020-08-28 LAB — POCT URINALYSIS DIP (PROADVANTAGE DEVICE)
Bilirubin, UA: NEGATIVE
Blood, UA: NEGATIVE
Glucose, UA: NEGATIVE mg/dL
Ketones, POC UA: NEGATIVE mg/dL
Leukocytes, UA: NEGATIVE
Nitrite, UA: NEGATIVE
Protein Ur, POC: NEGATIVE mg/dL
Specific Gravity, Urine: 1.015
Urobilinogen, Ur: NEGATIVE
pH, UA: 6 (ref 5.0–8.0)

## 2020-08-28 MED ORDER — MONTELUKAST SODIUM 10 MG PO TABS: ORAL_TABLET | ORAL | 3 refills | Status: DC

## 2020-08-28 MED ORDER — HYDROCHLOROTHIAZIDE 12.5 MG PO CAPS: ORAL_CAPSULE | ORAL | 3 refills | Status: DC

## 2020-08-28 MED ORDER — LISINOPRIL 20 MG PO TABS: 20.0000 mg | ORAL_TABLET | Freq: Every day | ORAL | 3 refills | Status: DC

## 2020-08-29 LAB — CBC WITH DIFFERENTIAL/PLATELET
Basophils Absolute: 0.1 10*3/uL (ref 0.0–0.2)
Basos: 1 %
EOS (ABSOLUTE): 0 10*3/uL (ref 0.0–0.4)
Eos: 1 %
Hematocrit: 43.4 % (ref 37.5–51.0)
Hemoglobin: 15 g/dL (ref 13.0–17.7)
Immature Grans (Abs): 0 10*3/uL (ref 0.0–0.1)
Immature Granulocytes: 1 %
Lymphocytes Absolute: 1.7 10*3/uL (ref 0.7–3.1)
Lymphs: 29 %
MCH: 29.6 pg (ref 26.6–33.0)
MCHC: 34.6 g/dL (ref 31.5–35.7)
MCV: 86 fL (ref 79–97)
Monocytes Absolute: 0.5 10*3/uL (ref 0.1–0.9)
Monocytes: 8 %
Neutrophils Absolute: 3.5 10*3/uL (ref 1.4–7.0)
Neutrophils: 60 %
Platelets: 273 10*3/uL (ref 150–450)
RBC: 5.07 x10E6/uL (ref 4.14–5.80)
RDW: 12.5 % (ref 11.6–15.4)
WBC: 5.8 10*3/uL (ref 3.4–10.8)

## 2020-08-29 LAB — HEMOGLOBIN A1C
Est. average glucose Bld gHb Est-mCnc: 111 mg/dL
Hgb A1c MFr Bld: 5.5 % (ref 4.8–5.6)

## 2020-08-29 LAB — LIPID PANEL
Chol/HDL Ratio: 3.7 ratio (ref 0.0–5.0)
Cholesterol, Total: 183 mg/dL (ref 100–199)
HDL: 49 mg/dL (ref >39)
LDL Chol Calc (NIH): 108 mg/dL — ABNORMAL HIGH (ref 0–99)
Triglycerides: 148 mg/dL (ref 0–149)
VLDL Cholesterol Cal: 26 mg/dL (ref 5–40)

## 2020-08-29 LAB — COMPREHENSIVE METABOLIC PANEL
ALT: 24 IU/L (ref 0–44)
AST: 17 IU/L (ref 0–40)
Albumin/Globulin Ratio: 1.6 (ref 1.2–2.2)
Albumin: 4.7 g/dL (ref 3.8–4.8)
Alkaline Phosphatase: 92 IU/L (ref 44–121)
BUN/Creatinine Ratio: 11 (ref 10–24)
BUN: 9 mg/dL (ref 8–27)
Bilirubin Total: 0.4 mg/dL (ref 0.0–1.2)
CO2: 25 mmol/L (ref 20–29)
Calcium: 9.9 mg/dL (ref 8.6–10.2)
Chloride: 102 mmol/L (ref 96–106)
Creatinine, Ser: 0.85 mg/dL (ref 0.76–1.27)
GFR calc Af Amer: 107 mL/min/{1.73_m2} (ref >59)
GFR calc non Af Amer: 93 mL/min/{1.73_m2} (ref >59)
Globulin, Total: 2.9 g/dL (ref 1.5–4.5)
Glucose: 115 mg/dL — ABNORMAL HIGH (ref 65–99)
Potassium: 4.7 mmol/L (ref 3.5–5.2)
Sodium: 142 mmol/L (ref 134–144)
Total Protein: 7.6 g/dL (ref 6.0–8.5)

## 2020-08-29 LAB — PSA: Prostate Specific Ag, Serum: 1 ng/mL (ref 0.0–4.0)

## 2020-10-20 ENCOUNTER — Telehealth: Payer: BC Managed Care – PPO | Admitting: Family Medicine

## 2020-10-20 ENCOUNTER — Other Ambulatory Visit (INDEPENDENT_AMBULATORY_CARE_PROVIDER_SITE_OTHER): Payer: BC Managed Care – PPO

## 2020-10-20 ENCOUNTER — Encounter: Payer: Self-pay | Admitting: Family Medicine

## 2020-10-20 ENCOUNTER — Telehealth: Payer: Self-pay

## 2020-10-20 VITALS — BP 136/76 | HR 80 | Wt 190.0 lb

## 2020-10-20 DIAGNOSIS — R059 Cough, unspecified: Secondary | ICD-10-CM | POA: Diagnosis not present

## 2020-10-20 DIAGNOSIS — R0981 Nasal congestion: Secondary | ICD-10-CM

## 2020-10-20 DIAGNOSIS — R0982 Postnasal drip: Secondary | ICD-10-CM

## 2020-10-20 DIAGNOSIS — J3489 Other specified disorders of nose and nasal sinuses: Secondary | ICD-10-CM

## 2020-10-20 LAB — POC INFLUENZA A&B (BINAX/QUICKVUE)
Influenza A, POC: NEGATIVE
Influenza B, POC: NEGATIVE

## 2020-10-20 LAB — POC COVID19 BINAXNOW: SARS Coronavirus 2 Ag: NEGATIVE

## 2020-10-20 NOTE — Telephone Encounter (Signed)
Called pt. LM rapid negative and waiting on PCR results.

## 2020-10-20 NOTE — Progress Notes (Signed)
   Subjective:  Documentation for virtual audio and video telecommunications through [facility] encounter:  The patient was located at home. 2 patient identifiers used.  The provider was located in the office. The patient did consent to this visit and is aware of possible charges through their insurance for this visit.  The other persons participating in this telemedicine service were none. Time spent on call was 16 minutes and in review of previous records 20 minutes total.  This virtual service is not related to other E/M service within previous 7 days.   Patient ID: Tyler Martin, male    DOB: September 16, 1956, 64 y.o.   MRN: [id number]  HPI Chief Complaint  Patient presents with  . possible covid    Possible covid. Home test Sunday was negative. Symptoms- Saturday, ST, cough, runny nose,headache. Had zpak left over so started that on Sunday. At night is worse.,sinus pressure.   Complains of a 6 day history of sore throat, headache, rhinorrhea, nasal congestion, sinus pressure and dry cough. He had a negative home test the day after symptom onset.  States he also had a Zpak at home and he took it. States it did not help with his cough.   No fever, chills, body aches, fatigue, shortness of breath, wheezing, chest pain, abdominal pain, N/V/D.   States he had 2 Moderna vaccines and is due for his booster next month.   States in March 2021 he had Covid and was quite sick.   Reviewed allergies, medications, past medical, surgical, family, and social history.    Review of Systems Pertinent positives and negatives in the history of present illness.     Objective:   Physical Exam BP 136/76   Pulse 80   Wt 190 lb (86.2 kg)   BMI 27.66 kg/m   Alert and oriented in no acute distress.  Respirations unlabored.  He is speaking in complete sentences without difficulty.       Assessment & Plan:  Nasal congestion with rhinorrhea - Plan: Novel Coronavirus, NAA ([facility]), POC COVID-19  BinaxNow, POC Influenza A&B(BINAX/QUICKVUE)  Cough - Plan: Novel Coronavirus, NAA ([facility]), POC COVID-19 BinaxNow, POC Influenza A&B(BINAX/QUICKVUE)  Post-nasal drainage - Plan: Novel Coronavirus, NAA ([facility]), POC COVID-19 BinaxNow, POC Influenza A&B(BINAX/QUICKVUE)  He is not in any acute distress.  He will come to the office parking lot for rapid COVID, flu and PCR testing.  Unlikely that he has flu but we will rule it out. We discussed in depth supportive care including hydration, Mucinex DM or Robitussin-DM.  He also has Lawyer at home which he will take. Continue with his usual allergy medication.  I will follow-up with him pending his results and he will let me know if he is worsening over the next few days.  We discussed quarantining.

## 2020-10-22 ENCOUNTER — Encounter: Payer: Self-pay | Admitting: Family Medicine

## 2020-10-22 LAB — SARS-COV-2, NAA 2 DAY TAT

## 2020-10-22 LAB — NOVEL CORONAVIRUS, NAA: SARS-CoV-2, NAA: DETECTED — AB

## 2020-10-22 NOTE — Progress Notes (Signed)
He is Covid PCR positive. I sent him a Clinical cytogeneticist message. Please report.

## 2021-02-22 ENCOUNTER — Encounter: Payer: Self-pay | Admitting: Family Medicine

## 2021-02-23 ENCOUNTER — Ambulatory Visit: Payer: BC Managed Care – PPO | Admitting: Family Medicine

## 2021-04-05 DIAGNOSIS — H40033 Anatomical narrow angle, bilateral: Secondary | ICD-10-CM | POA: Diagnosis not present

## 2021-04-05 DIAGNOSIS — H5203 Hypermetropia, bilateral: Secondary | ICD-10-CM | POA: Diagnosis not present

## 2021-04-05 DIAGNOSIS — H2513 Age-related nuclear cataract, bilateral: Secondary | ICD-10-CM | POA: Diagnosis not present

## 2021-04-09 ENCOUNTER — Other Ambulatory Visit: Payer: Self-pay

## 2021-04-09 ENCOUNTER — Other Ambulatory Visit: Payer: Self-pay | Admitting: Family Medicine

## 2021-04-09 DIAGNOSIS — B001 Herpesviral vesicular dermatitis: Secondary | ICD-10-CM

## 2021-04-09 DIAGNOSIS — F411 Generalized anxiety disorder: Secondary | ICD-10-CM

## 2021-04-10 MED ORDER — ALPRAZOLAM 1 MG PO TABS: 0.5000 mg | ORAL_TABLET | Freq: Every evening | ORAL | 0 refills | Status: DC | PRN

## 2021-04-10 MED ORDER — VALACYCLOVIR HCL 1 G PO TABS: ORAL_TABLET | ORAL | 0 refills | Status: DC

## 2021-04-10 NOTE — Telephone Encounter (Signed)
Pt is requesting to fill valtrex at [facility]. Please advise [facility]

## 2021-04-10 NOTE — Telephone Encounter (Signed)
Pt is requesting to fill xanax. Please advise [facility]

## 2021-04-12 DIAGNOSIS — Z20822 Contact with and (suspected) exposure to covid-19: Secondary | ICD-10-CM | POA: Diagnosis not present

## 2021-06-29 ENCOUNTER — Encounter: Payer: Self-pay | Admitting: Family Medicine

## 2021-06-29 ENCOUNTER — Telehealth: Payer: BC Managed Care – PPO | Admitting: Medical

## 2021-06-29 ENCOUNTER — Encounter: Payer: Self-pay | Admitting: Medical

## 2021-06-29 VITALS — Wt 195.0 lb

## 2021-06-29 DIAGNOSIS — R059 Cough, unspecified: Secondary | ICD-10-CM | POA: Diagnosis not present

## 2021-06-29 DIAGNOSIS — R0981 Nasal congestion: Secondary | ICD-10-CM | POA: Diagnosis not present

## 2021-06-29 MED ORDER — AZITHROMYCIN 250 MG PO TABS
ORAL_TABLET | ORAL | 0 refills | Status: DC
Start: 2021-06-29 — End: 2021-09-03

## 2021-06-29 MED ORDER — HYDROCODONE BIT-HOMATROP MBR 5-1.5 MG/5ML PO SOLN: 5.0000 mL | Freq: Three times a day (TID) | ORAL | 0 refills | Status: AC | PRN

## 2021-06-29 NOTE — Progress Notes (Signed)
Subjective:     Patient ID: Tyler Martin, male   DOB: 12/25/1955, 65 y.o.   MRN: [id number]  This visit type was conducted due to national recommendations for restrictions regarding the COVID-19 Pandemic (e.g. social distancing) in an effort to limit this patient's exposure and mitigate transmission in our community.  Due to their co-morbid illnesses, this patient is at least at moderate risk for complications without adequate follow up.  This format is felt to be most appropriate for this patient at this time.    Documentation for virtual audio and video telecommunications through [facility] encounter:  The patient was located at home. The provider was located in the office. The patient did consent to this visit and is aware of possible charges through their insurance for this visit.  The other persons participating in this telemedicine service were none. Time spent on call was 20 minutes and in review of previous records 20 minutes total.  This virtual service is not related to other E/M service within previous 7 days.   HPI Chief Complaint  Patient presents with   cough    Cough nasal congestion x Tuesday- Wednesday. No fever. Negative covid test today.    Virtual consult for cough, congestion x 4 days.  No fever.  Cough and congestion getting worse.   Was on a golf outing with a group and been around a lot of people.   Several people had colds.  No specific covid contacts.   Sometimes cough is productive.   No NVD.  No body aches or chills.  Has had headache.   No sore throat.   Using mucinex DM some.  No hx/o asthma or lung disease, no prior inhaler use.  No other aggravating or relieving factors.  Had covid back in March 2022. No other complaint.  Past Medical History:  Diagnosis Date   Allergic rhinitis, cause unspecified    seasonal   Anxiety state, unspecified    resolved, related to death of nephew   Diverticulosis 10/13/07   on colonoscopy (Dr. Elnoria Howard)   Hemorrhoids  10/13/07   Herpes labialis    (prev treated by Dr. Yetta Barre with Valtrex 1 gm)   Hypertension 2012   Current Outpatient Medications on File Prior to Visit  Medication Sig Dispense Refill   ALPRAZolam (XANAX) 1 MG tablet Take 0.5-1 tablets (0.5-1 mg total) by mouth at bedtime as needed for sleep. 30 tablet 0   hydrochlorothiazide (MICROZIDE) 12.5 MG capsule TAKE 1 CAPSULE BY MOUTH EVERY DAY 90 capsule 3   hydrocortisone 2.5 % ointment APPLY TO AFFECTED AREA TWICE A DAY  1   lisinopril (ZESTRIL) 20 MG tablet Take 1 tablet (20 mg total) by mouth daily. 90 tablet 3   loratadine (CLARITIN) 10 MG tablet Take 10 mg by mouth daily.     montelukast (SINGULAIR) 10 MG tablet TAKE 1 TABLET BY MOUTH EVERYDAY AT BEDTIME 90 tablet 3   Multiple Vitamins-Minerals (MULTIVITAMIN WITH MINERALS) tablet Take 1 tablet by mouth daily.     valACYclovir (VALTREX) 1000 MG tablet TAKE 2 TABLETS AT ONSET OF COLD SORE. REPEAT ONCE IN 12 HOURS (TOTAL OF 4 TABLETS PER COURSE) 30 tablet 0   No current facility-administered medications on file prior to visit.    Review of Systems As in subjective    Objective:   Physical Exam Due to coronavirus pandemic stay at home measures, patient visit was virtual and they were not examined in person.   Wt 195 lb (88.5 kg)  BMI 28.38 kg/m  Gen: wd, wn, nad No labored breathing, no wheezing     Assessment:     Encounter Diagnoses  Name Primary?   Cough Yes   Head congestion        Plan:     Currently symptoms suggestive of viral respiratory tract infection.  He will continue hydration, Mucinex DM, rest, Tylenol or ibuprofen for any aches or pains or fever that may develop.  We discussed possibly coming in for PCR COVID test to be sure, but by the time he gets results he would be after the 5-day window for starting Paxlovid if he were to test positive.  So at this point he is going to quarantine until symptoms significantly improved.  We discussed wearing a mask for  additional 5 days just in case once quarantine ends.  He can use Hycodan cough syrup for the weekend for worse cough.  Discussed risk and benefits of medication.  And if symptoms get much worse over the weekend particularly thick mucus or rapidly chest with wet cough or significantly worsening he will begin Z-Pak.  Otherwise we are going to treat this as a viral respiratory tract infection.  Tyler Martin was seen today for cough.  Diagnoses and all orders for this visit:  Cough  Head congestion  Other orders -     azithromycin (ZITHROMAX) 250 MG tablet; 2 tablets day 1, then 1 tablet days 2-4 -     HYDROcodone bit-homatropine (HYCODAN) 5-1.5 MG/5ML syrup; Take 5 mLs by mouth every 8 (eight) hours as needed for up to 5 days for cough.  F/u prn

## 2021-07-10 ENCOUNTER — Other Ambulatory Visit (INDEPENDENT_AMBULATORY_CARE_PROVIDER_SITE_OTHER): Payer: BC Managed Care – PPO

## 2021-07-10 ENCOUNTER — Other Ambulatory Visit: Payer: Self-pay

## 2021-07-10 DIAGNOSIS — Z23 Encounter for immunization: Secondary | ICD-10-CM

## 2021-08-15 ENCOUNTER — Other Ambulatory Visit: Payer: Self-pay | Admitting: Family Medicine

## 2021-08-15 DIAGNOSIS — I10 Essential (primary) hypertension: Secondary | ICD-10-CM

## 2021-09-02 NOTE — Patient Instructions (Addendum)
  HEALTH MAINTENANCE RECOMMENDATIONS:  It is recommended that you get at least 30 minutes of aerobic exercise at least 5 days/week (for weight loss, you may need as much as 60-90 minutes). This can be any activity that gets your heart rate up. This can be divided in 10-15 minute intervals if needed, but try and build up your endurance at least once a week.  Weight bearing exercise is also recommended twice weekly.  Eat a healthy diet with lots of vegetables, fruits and fiber.  "Colorful" foods have a lot of vitamins (ie green vegetables, tomatoes, red peppers, etc).  Limit sweet tea, regular sodas and alcoholic beverages, all of which has a lot of calories and sugar.  Up to 2 alcoholic drinks daily may be beneficial for men (unless trying to lose weight, watch sugars).  Drink a lot of water.  Sunscreen of at least SPF 30 should be used on all sun-exposed parts of the skin when outside between the hours of 10 am and 4 pm (not just when at beach or pool, but even with exercise, golf, tennis, and yard work!)  Use a sunscreen that says "broad spectrum" so it covers both UVA and UVB rays, and make sure to reapply every 1-2 hours.  Remember to change the batteries in your smoke detectors when changing your clock times in the spring and fall.  Carbon monoxide detectors are recommended for your home.  Use your seat belt every time you are in a car, and please drive safely and not be distracted with cell phones and texting while driving.  Next year (age 65) you will be due for a pneumonia vaccine.  If you aren't on Medicare at that time, I'd like for you to check with your insurance prior to your visit to see if they cover the newer vaccine (currently not well covered by commercial insurances, but is by Medicare), Prevnar-20. If it isn't covered, we will give you pneumovax, and give the Prevnar-20 either a year later, or when covered.  (Prevnar-20 replaces both Prevnar-13 and pneumovax--which up until now,  we  have been giving both vaccines, a year apart).  Try and limit the olives, processed foods, and look at sodium content in your foods. Soups and potatoes are often very high when in restaurants. Goal blood pressure is <130/80.  If consistently <110, let us know and we can consider titrating down the medication.  This will take weight loss and more diligence with sodium intake before that might happen.  Feel free to send [place] a list of your blood pressures through MyChart.
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
## 2021-09-03 ENCOUNTER — Other Ambulatory Visit: Payer: Self-pay

## 2021-09-03 ENCOUNTER — Ambulatory Visit: Payer: BC Managed Care – PPO | Admitting: Family Medicine

## 2021-09-03 ENCOUNTER — Encounter: Payer: Self-pay | Admitting: Family Medicine

## 2021-09-03 VITALS — BP 150/80 | HR 76 | Ht 70.0 in | Wt 203.8 lb

## 2021-09-03 DIAGNOSIS — I10 Essential (primary) hypertension: Secondary | ICD-10-CM | POA: Diagnosis not present

## 2021-09-03 DIAGNOSIS — Z0000 Encounter for general adult medical examination without abnormal findings: Secondary | ICD-10-CM | POA: Diagnosis not present

## 2021-09-03 DIAGNOSIS — B001 Herpesviral vesicular dermatitis: Secondary | ICD-10-CM | POA: Diagnosis not present

## 2021-09-03 DIAGNOSIS — Z125 Encounter for screening for malignant neoplasm of prostate: Secondary | ICD-10-CM

## 2021-09-03 DIAGNOSIS — J301 Allergic rhinitis due to pollen: Secondary | ICD-10-CM

## 2021-09-03 DIAGNOSIS — R7301 Impaired fasting glucose: Secondary | ICD-10-CM | POA: Diagnosis not present

## 2021-09-03 LAB — POCT URINALYSIS DIP (PROADVANTAGE DEVICE)
Bilirubin, UA: NEGATIVE
Blood, UA: NEGATIVE
Glucose, UA: NEGATIVE mg/dL
Ketones, POC UA: NEGATIVE mg/dL
Leukocytes, UA: NEGATIVE
Nitrite, UA: NEGATIVE
Protein Ur, POC: NEGATIVE mg/dL
Specific Gravity, Urine: 1.015
Urobilinogen, Ur: NEGATIVE
pH, UA: 6 (ref 5.0–8.0)

## 2021-09-03 MED ORDER — MONTELUKAST SODIUM 10 MG PO TABS: ORAL_TABLET | ORAL | 3 refills | Status: DC

## 2021-09-03 MED ORDER — AZITHROMYCIN 250 MG PO TABS: ORAL_TABLET | ORAL | 0 refills | Status: DC

## 2021-09-03 MED ORDER — LISINOPRIL 20 MG PO TABS: 20.0000 mg | ORAL_TABLET | Freq: Every day | ORAL | 3 refills | Status: DC

## 2021-09-03 MED ORDER — VALACYCLOVIR HCL 1 G PO TABS: ORAL_TABLET | ORAL | 0 refills | Status: DC

## 2021-09-03 MED ORDER — HYDROCHLOROTHIAZIDE 12.5 MG PO CAPS: ORAL_CAPSULE | ORAL | 3 refills | Status: DC

## 2021-09-04 ENCOUNTER — Encounter: Payer: Self-pay | Admitting: Family Medicine

## 2021-09-04 DIAGNOSIS — R7303 Prediabetes: Secondary | ICD-10-CM | POA: Insufficient documentation

## 2021-09-04 LAB — PSA: Prostate Specific Ag, Serum: 1 ng/mL (ref 0.0–4.0)

## 2021-09-04 LAB — CBC WITH DIFFERENTIAL/PLATELET
Basophils Absolute: 0.1 10*3/uL (ref 0.0–0.2)
Basos: 1 %
EOS (ABSOLUTE): 0.1 10*3/uL (ref 0.0–0.4)
Eos: 1 %
Hematocrit: 46.5 % (ref 37.5–51.0)
Hemoglobin: 16.2 g/dL (ref 13.0–17.7)
Immature Grans (Abs): 0 10*3/uL (ref 0.0–0.1)
Immature Granulocytes: 1 %
Lymphocytes Absolute: 2.1 10*3/uL (ref 0.7–3.1)
Lymphs: 30 %
MCH: 30.2 pg (ref 26.6–33.0)
MCHC: 34.8 g/dL (ref 31.5–35.7)
MCV: 87 fL (ref 79–97)
Monocytes Absolute: 0.6 10*3/uL (ref 0.1–0.9)
Monocytes: 8 %
Neutrophils Absolute: 4.2 10*3/uL (ref 1.4–7.0)
Neutrophils: 59 %
Platelets: 255 10*3/uL (ref 150–450)
RBC: 5.36 x10E6/uL (ref 4.14–5.80)
RDW: 12.5 % (ref 11.6–15.4)
WBC: 7 10*3/uL (ref 3.4–10.8)

## 2021-09-04 LAB — COMPREHENSIVE METABOLIC PANEL
ALT: 34 IU/L (ref 0–44)
AST: 27 IU/L (ref 0–40)
Albumin/Globulin Ratio: 1.8 (ref 1.2–2.2)
Albumin: 4.9 g/dL — ABNORMAL HIGH (ref 3.8–4.8)
Alkaline Phosphatase: 97 IU/L (ref 44–121)
BUN/Creatinine Ratio: 17 (ref 10–24)
BUN: 14 mg/dL (ref 8–27)
Bilirubin Total: 0.7 mg/dL (ref 0.0–1.2)
CO2: 26 mmol/L (ref 20–29)
Calcium: 10.2 mg/dL (ref 8.6–10.2)
Chloride: 99 mmol/L (ref 96–106)
Creatinine, Ser: 0.83 mg/dL (ref 0.76–1.27)
Globulin, Total: 2.7 g/dL (ref 1.5–4.5)
Glucose: 107 mg/dL — ABNORMAL HIGH (ref 70–99)
Potassium: 4.1 mmol/L (ref 3.5–5.2)
Sodium: 140 mmol/L (ref 134–144)
Total Protein: 7.6 g/dL (ref 6.0–8.5)
eGFR: 98 mL/min/{1.73_m2} (ref >59)

## 2021-09-04 LAB — HEMOGLOBIN A1C
Est. average glucose Bld gHb Est-mCnc: 120 mg/dL
Hgb A1c MFr Bld: 5.8 % — ABNORMAL HIGH (ref 4.8–5.6)

## 2021-09-04 LAB — LIPID PANEL
Chol/HDL Ratio: 3.5 ratio (ref 0.0–5.0)
Cholesterol, Total: 186 mg/dL (ref 100–199)
HDL: 53 mg/dL (ref >39)
LDL Chol Calc (NIH): 98 mg/dL (ref 0–99)
Triglycerides: 203 mg/dL — ABNORMAL HIGH (ref 0–149)
VLDL Cholesterol Cal: 35 mg/dL (ref 5–40)

## 2021-10-11 ENCOUNTER — Other Ambulatory Visit: Payer: Self-pay | Admitting: Family Medicine

## 2021-10-11 DIAGNOSIS — F411 Generalized anxiety disorder: Secondary | ICD-10-CM

## 2021-10-11 NOTE — Telephone Encounter (Signed)
Is this okay to refill? 

## 2022-01-23 IMAGING — DX DG CHEST 2V
2 series · 2 of 2 positions shown · non-contrast
Comparison: None.

CLINICAL DATA: Cough, fever

EXAM:
CHEST - 2 VIEW

[chest pa]
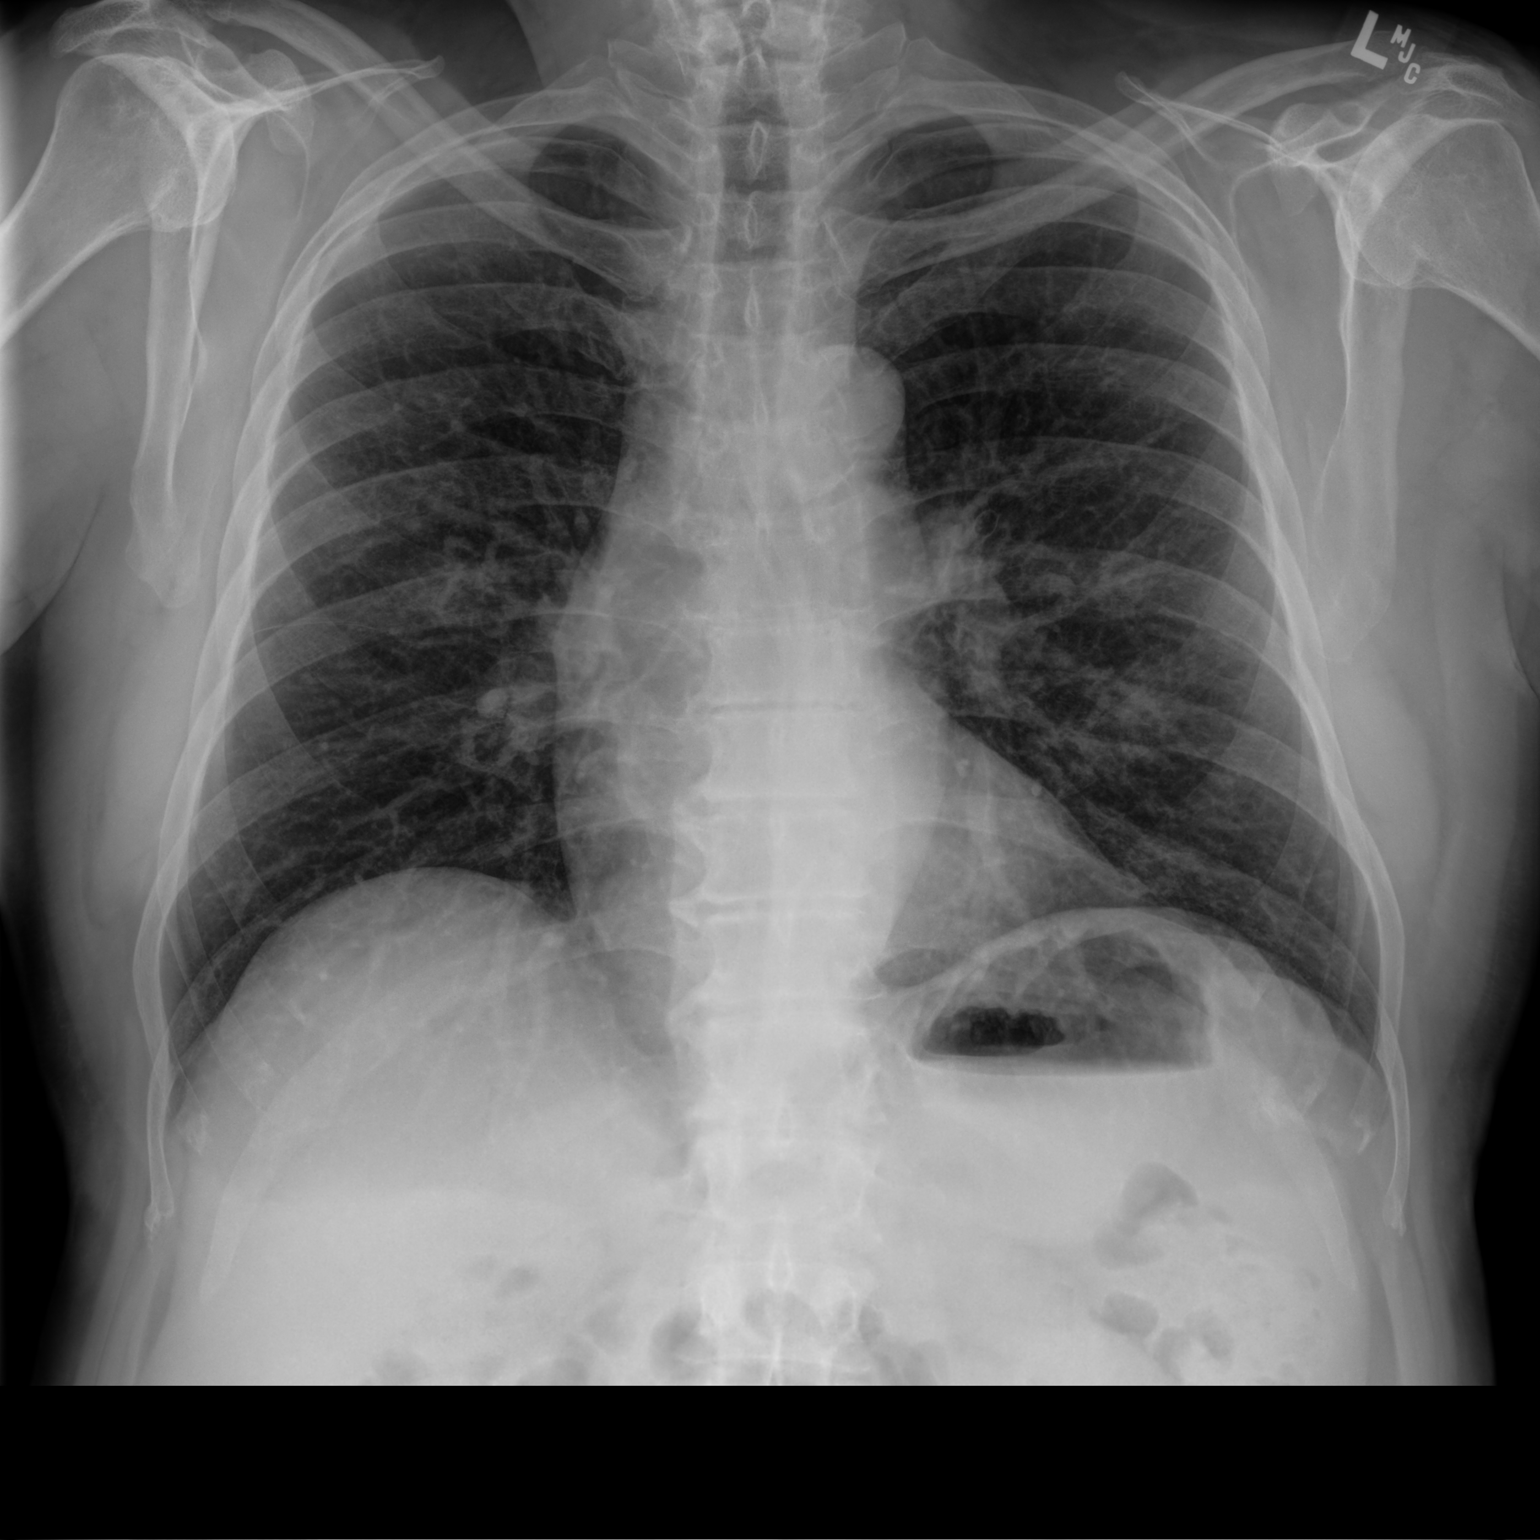

[chest lat]
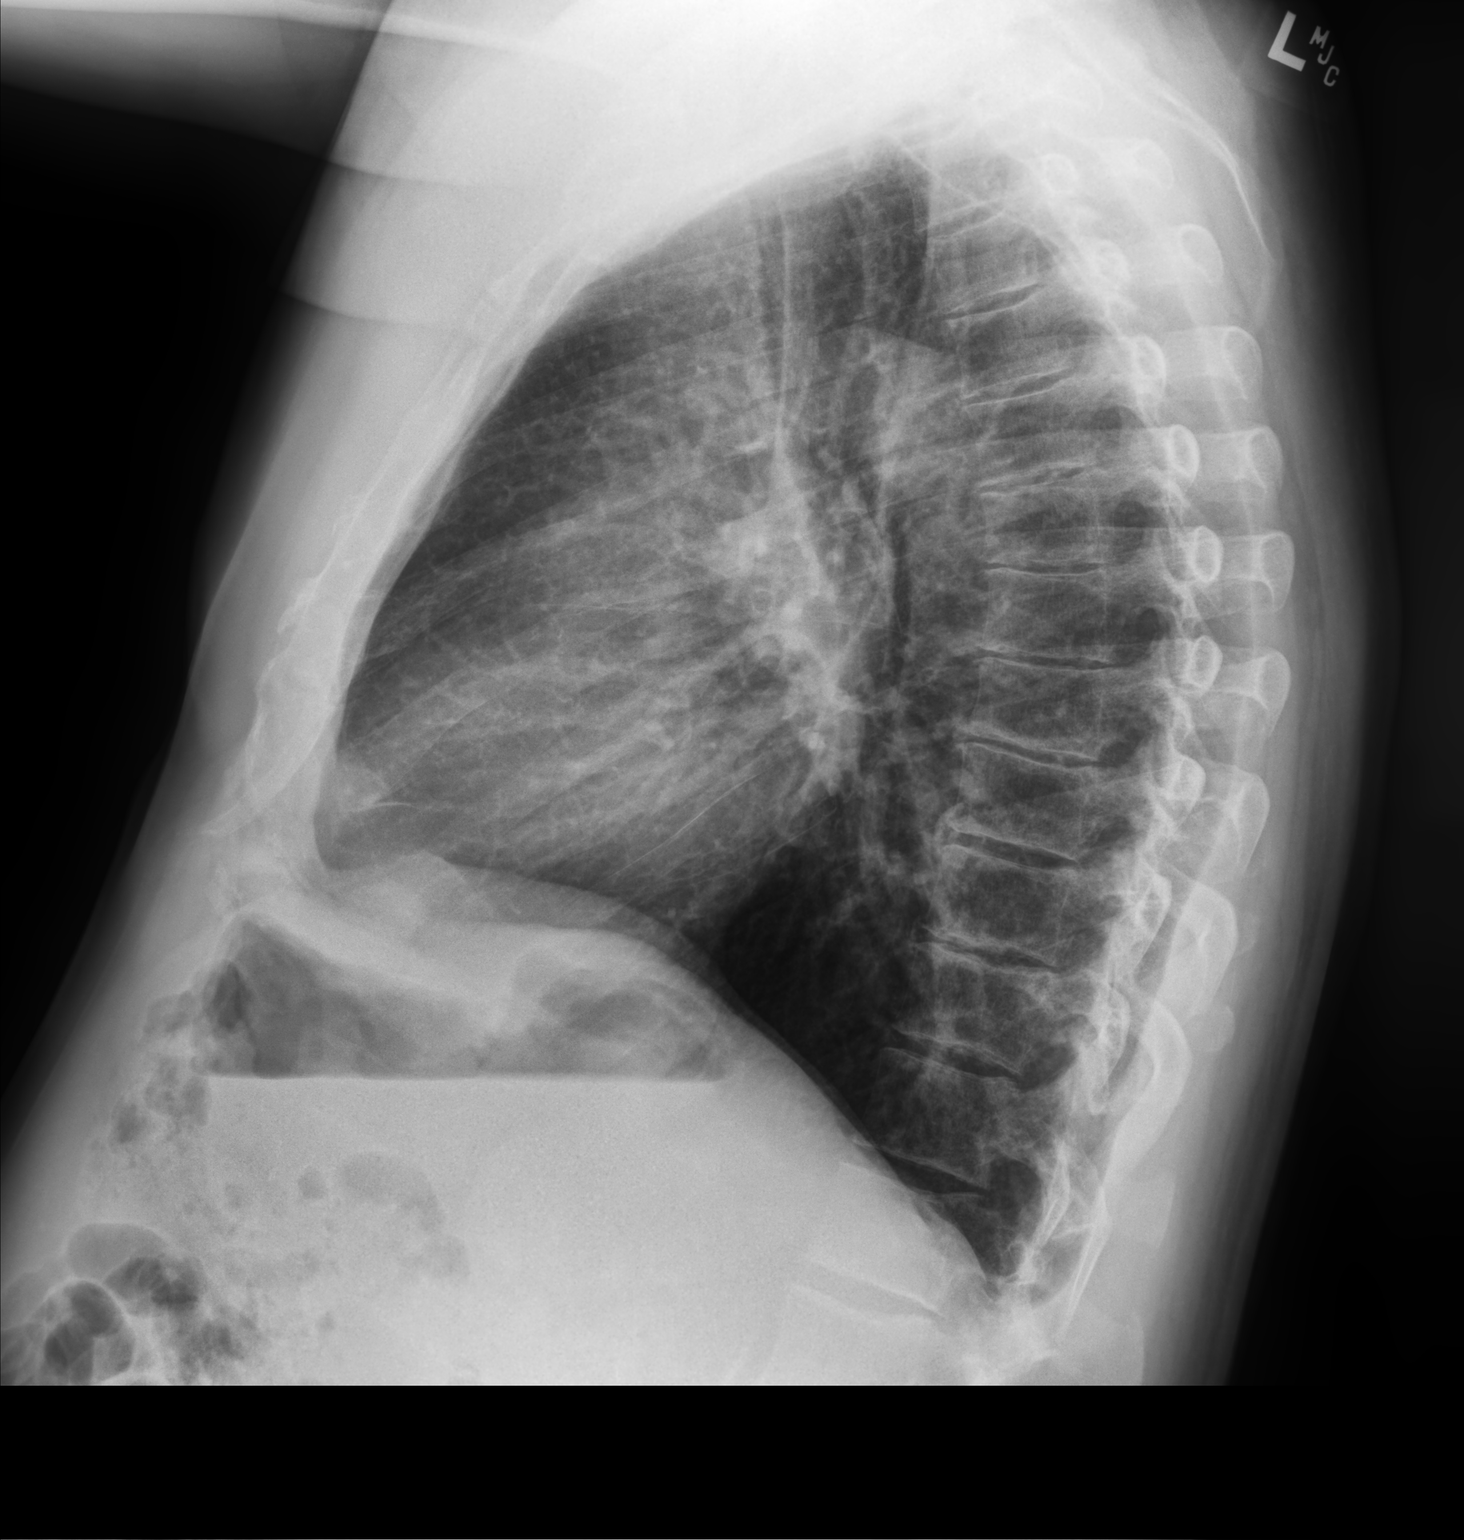

[2 of 2 positions shown; findings below may reference images not displayed]

FINDINGS: Heart and mediastinal contours are within normal limits. No focal
opacities or effusions. No acute bony abnormality.
IMPRESSION: No active cardiopulmonary disease.

## 2022-04-12 DIAGNOSIS — H2513 Age-related nuclear cataract, bilateral: Secondary | ICD-10-CM | POA: Diagnosis not present

## 2022-04-12 DIAGNOSIS — H5203 Hypermetropia, bilateral: Secondary | ICD-10-CM | POA: Diagnosis not present

## 2022-04-19 DIAGNOSIS — H1033 Unspecified acute conjunctivitis, bilateral: Secondary | ICD-10-CM | POA: Diagnosis not present

## 2022-04-24 ENCOUNTER — Other Ambulatory Visit: Payer: Self-pay | Admitting: Family Medicine

## 2022-04-24 DIAGNOSIS — F411 Generalized anxiety disorder: Secondary | ICD-10-CM

## 2022-04-25 NOTE — Telephone Encounter (Signed)
Is this okay to refill? 

## 2022-06-05 ENCOUNTER — Encounter: Payer: Self-pay | Admitting: Internal Medicine

## 2022-06-13 ENCOUNTER — Encounter: Payer: Self-pay | Admitting: Family Medicine

## 2022-06-13 ENCOUNTER — Telehealth (INDEPENDENT_AMBULATORY_CARE_PROVIDER_SITE_OTHER): Payer: BC Managed Care – PPO | Admitting: Family Medicine

## 2022-06-13 VITALS — BP 135/74 | HR 88 | Temp 95.1°F | Ht 70.0 in | Wt 195.0 lb

## 2022-06-13 DIAGNOSIS — B349 Viral infection, unspecified: Secondary | ICD-10-CM | POA: Diagnosis not present

## 2022-06-13 DIAGNOSIS — U071 COVID-19: Secondary | ICD-10-CM

## 2022-06-13 NOTE — Progress Notes (Signed)
Start time: 12:30 End time: 12:45  Virtual Visit via Video Note  I connected with Tyler Martin on 06/13/22 by a video enabled telemedicine application and verified that I am speaking with the correct person using two identifiers.  Location: Patient: home Provider: office   I discussed the limitations of evaluation and management by telemedicine and the availability of in person appointments. The patient expressed understanding and agreed to proceed.  History of Present Illness:  Chief Complaint  Patient presents with   Cough    VIRTUAL cough that started yesterday. Had a scratchy throat in the morning. Lots of coughing yesterday. Cough was bad last night. Has not taken a covid test yet. Feels like he had fever, but thermometer said he did not. Some body aches.    Yesterday morning he woke up with a scratchy throat. During the day, starting at lunch, his cough got progressively worse. The cough started out as dry, hacky.  Last night he started coughing up phlegm--clear, some was green. Throat felt worse last night. Today he is coughing some greenish phlegm. Took Delsym today, which helps some.  Denies nasal congestion, blows out some clear mucus.  Saliva seems thick. No sinus headache or any headache. +body aches--knees, arms are achey.  Negative home COVID test done just prior to visit.  Taking coricidin, delsym, mucinex  He thinks he feels warm (wife says he doesn't feel hot). Using a forehead thermometer   +sick contacts (--viral illness, negative for COVID)   PMH, PSH, SH reviewed  Outpatient Encounter Medications as of 06/13/2022  Medication Sig Note   Chlorpheniramine-DM (CORICIDIN HBP COUGH/COLD PO) Take 1 tablet by mouth as needed. 06/13/2022: Took 1 at 6am   diphenhydrAMINE (BENADRYL) 25 MG tablet Take 25 mg by mouth every 6 (six) hours as needed. 06/13/2022: Last dose 6pm   guaiFENesin (MUCINEX) 600 MG 12 hr tablet Take by mouth 2 (two) times daily. 06/13/2022:  Took 1 at 6am   hydrochlorothiazide (MICROZIDE) 12.5 MG capsule TAKE 1 CAPSULE BY MOUTH EVERY DAY    ibuprofen (ADVIL) 200 MG tablet Take 200 mg by mouth every 6 (six) hours as needed. 06/13/2022: Took 1 at 6am   lisinopril (ZESTRIL) 20 MG tablet Take 1 tablet (20 mg total) by mouth daily.    loratadine (CLARITIN) 10 MG tablet Take 10 mg by mouth daily.    montelukast (SINGULAIR) 10 MG tablet TAKE 1 TABLET BY MOUTH EVERYDAY AT BEDTIME    Multiple Vitamins-Minerals (MULTIVITAMIN WITH MINERALS) tablet Take 1 tablet by mouth daily.    ALPRAZolam (XANAX) 1 MG tablet TAKE 0.5-1 TABLETS (0.5-1 MG TOTAL) BY MOUTH AT BEDTIME AS NEEDED FOR SLEEP. (Patient not taking: Reported on 06/13/2022) 06/13/2022: prn   hydrocortisone 2.5 % ointment APPLY TO AFFECTED AREA TWICE A DAY (Patient not taking: Reported on 06/13/2022) 07/21/2017: Uses prn rashes   valACYclovir (VALTREX) 1000 MG tablet TAKE 2 TABLETS AT ONSET OF COLD SORE. REPEAT ONCE IN 12 HOURS (TOTAL OF 4 TABLETS PER COURSE) (Patient not taking: Reported on 06/13/2022) 06/13/2022: prn   [DISCONTINUED] azithromycin (ZITHROMAX) 250 MG tablet Take 2 tablets by mouth on first day, then 1 tablet by mouth on days 2 through 5.  Please contact your MD prior to taking this    No facility-administered encounter medications on file as of 06/13/2022.   No Known Allergies  ROS:  No n/v/d.  Cough, chills, myalgias, sore throat per HPI. No rashes, shortness of breath, chest pain, or other concerns. See HPI.  Observations/Objective:  BP 135/74   Pulse 88   Temp (!) 95.1 F (35.1 C) (Temporal)   Ht 5\' 10"  (1.778 m)   Wt 195 lb (88.5 kg)   BMI 27.98 kg/m   Well-appearing male.  He sounds slightly hoarse, occasional cough. He is speaking comfortably, in no distress He is alert and oriented. Cranial nerves are grossly intact. Exam is limited due to the virtual nature of the visit.   Assessment and Plan:  Acute viral syndrome - COVID negative today, but early.  Rec repeat testing in 1-2d. Supportive measures reviewed, and sx for which he should seek in-person eval discussed  He will repeat COVID test tomorrow afternoon, and contact if + for antiviral treatment. Rest and fluids, cont current OTC meds.  [facility]   Follow Up Instructions:    I discussed the assessment and treatment plan with the patient. The patient was provided an opportunity to ask questions and all were answered. The patient agreed with the plan and demonstrated an understanding of the instructions.   The patient was advised to call back or seek an in-person evaluation if the symptoms worsen or if the condition fails to improve as anticipated.  I spent 20 minutes dedicated to the care of this patient, including pre-visit review of records, face to face time, post-visit ordering of testing and documentation.    [place], MD

## 2022-06-13 NOTE — Patient Instructions (Signed)
Stay well hydrated, and get plenty of rest. Continue the mucinex, delsym and coricidin--be sure to look at the labels and ensure that you aren't overlapping any ingredients between these meds (the mostly likely would be the dextromethorphan cough suppressant).  Repeat your COVID test tomorrow, and let us know if positive, so that we can send a prescription for an antiviral to your pharmacy.  Please seek re-evaluation if you have persistent or worsening fever, cough, if you develop shortness of breath, chest pain, or other new or concerning symptoms.  I hope you feel better soon!

## 2022-06-14 MED ORDER — NIRMATRELVIR/RITONAVIR (PAXLOVID)TABLET: 3.0000 | ORAL_TABLET | Freq: Two times a day (BID) | ORAL | 0 refills | Status: AC

## 2022-07-09 ENCOUNTER — Encounter: Payer: Self-pay | Admitting: Internal Medicine

## 2022-07-29 ENCOUNTER — Other Ambulatory Visit: Payer: BC Managed Care – PPO

## 2022-07-30 ENCOUNTER — Other Ambulatory Visit: Payer: Self-pay | Admitting: Family Medicine

## 2022-07-30 DIAGNOSIS — B001 Herpesviral vesicular dermatitis: Secondary | ICD-10-CM

## 2022-07-30 MED ORDER — VALACYCLOVIR HCL 1 G PO TABS: ORAL_TABLET | ORAL | 0 refills | Status: DC

## 2022-07-30 NOTE — Telephone Encounter (Signed)
Is this okay to refill? 

## 2022-08-20 DIAGNOSIS — L570 Actinic keratosis: Secondary | ICD-10-CM | POA: Diagnosis not present

## 2022-08-20 DIAGNOSIS — Z85828 Personal history of other malignant neoplasm of skin: Secondary | ICD-10-CM | POA: Diagnosis not present

## 2022-08-20 DIAGNOSIS — L821 Other seborrheic keratosis: Secondary | ICD-10-CM | POA: Diagnosis not present

## 2022-08-20 DIAGNOSIS — D224 Melanocytic nevi of scalp and neck: Secondary | ICD-10-CM | POA: Diagnosis not present

## 2022-08-21 ENCOUNTER — Other Ambulatory Visit: Payer: Self-pay | Admitting: Family Medicine

## 2022-08-21 DIAGNOSIS — I10 Essential (primary) hypertension: Secondary | ICD-10-CM

## 2022-09-13 NOTE — Progress Notes (Unsigned)
No chief complaint on file.   Tyler Martin is a 66 y.o. male who presents for a complete physical and follow-up on chronic problems.    He had maintained most of the weight he lost through [place] program (completed in 07/2018), but at his physical last year he reported he started gaining some back when he went back to traveling more for work, plus holiday parties. Wt Readings from Last 3 Encounters:  06/13/22 195 lb (88.5 kg)  09/03/21 203 lb 12.8 oz (92.4 kg)  06/29/21 195 lb (88.5 kg)  9/14 visit was VIRTUAL    Hypertension follow-up: He continues on 78m of LIsinopril and HCTZ daily.  He is compliant with medications. BP's have been running   Denies headache, dizziness, cough, edema, muscle cramps, chest pain.  He is trying to limit sodium in his diet. Some increase recently (olives and bratwurst). UPDATE He continues to exercise regularly. BP Readings from Last 3 Encounters:  06/13/22 135/74  09/03/21 (!) 150/80  10/20/20 136/76      Allergies: He is taking singulair and claritin (sometimes zyrtec, whichever he has), with prn use of benadryl with flares (related to wet leaves). He previously used Flonase, stopped due to getting bumps in his nose, and hadn't been needing it.   Herpes Labialis: Infrequent flare, usually related to travel, sun, stress.  Flares with going to air shows and playing golf (sun).  Starts Valtrex as soon as he feels it coming on, and it responds well. This was last refilled 06/2022.   Anxiety/insomnia:  He uses 1/2 tablet of alprazolam intermittently to help with sleep, usually related to travel.  Last filled #30 in 03/2022, and prior refill was 09/2021. He rarely uses the alprazolam at home.  He has been taking melatonin nightly, and sleeping through the night.   Impaired fasting glucose.  Fasting glucose was 107 at his physical in 08/2021,  prior years had been 115, 108, 101, 105.  A1c's had all been normal, but was up to 5.8% last year. He  tries to limit sugar in beverages, cut back on pasta, breads, limits sweets, eating more salads.      Immunization History  Administered Date(s) Administered   Influenza Split 06/11/2012   Influenza, High Dose Seasonal PF 07/01/2018   Influenza,inj,Quad PF,6+ Mos 06/02/2013, 06/02/2014, 06/07/2015, 06/05/2016, 06/30/2017, 07/15/2019, 07/11/2020, 07/10/2021   Moderna Sars-Covid-2 Vaccination 12/02/2019, 05/08/2020   Tdap 08/30/2010, 08/28/2020   Zoster Recombinat (Shingrix) 08/08/2017, 11/07/2017    Last colonoscopy: 10/2017, had tubular adenoma, diverticulosis. He was told to f/u in 7 years. Last PSA: Lab Results  Component Value Date   PSA1 1.0 09/03/2021   PSA1 1.0 08/28/2020   PSA1 1.0 08/02/2019   PSA 0.7 07/21/2017   PSA 0.6 06/05/2016   PSA 1.61 06/02/2014   Dentist: twice yearly   Ophtho: yearly   Exercise:  Daily; uses weights and resistance bands at least 3x/week, and walking/jogging (45-637ms), and jumproping  at least 5x/week. Golfs frequently (not walking the courses as often as he used to in the last few months).  Other doctors caring for patient: Derm--Dr. JoRonnald Rampentist-- Ophtho-- GI--Dr. HuBenson Norwayrtho??     12/13/2019    9:05 AM 09/03/2021    9:45 AM  Fall Risk  Falls in the past year?  1  Was there an injury with Fall?  0  Was there an injury with Fall? - Comments  scrapped up knee  Fall Risk Category Calculator  2  Fall Risk Category  Moderate  Patient Fall Risk Level Low fall risk Low fall risk  Patient at Risk for Falls Due to  History of fall(s)  Fall risk Follow up  Falls evaluation completed      09/03/2021    9:46 AM 08/28/2020    8:47 AM 08/02/2019    2:52 PM 07/27/2018   10:08 AM 07/21/2017    9:59 AM  Depression screen PHQ 2/9  Decreased Interest 0 0 0 0 0  Down, Depressed, Hopeless 0 0  0 0  PHQ - 2 Score 0 0 0 0 0       PMH, PSH, SH and FH were reviewed and updated.     ROS: The patient denies anorexia, fever, headaches,  vision loss, decreased hearing, ear pain, hoarseness, chest pain, palpitations, dizziness, syncope, dyspnea on exertion, cough, swelling, nausea, vomiting, diarrhea, constipation, abdominal pain, melena, hematochezia, indigestion/heartburn, hematuria, incontinence, erectile dysfunction, nocturia, weakened urine stream, dysuria, genital lesions, joint pains, numbness, tingling, weakness, tremor, suspicious skin lesions, depression, abnormal bleeding/bruising, or enlarged lymph nodes.   Anxiety/sleep issues with travel only. Sees Dr. Ronnald Ramp regularly (derm). Allergies are controlled on current meds, though needing some benadryl also recently.      PHYSICAL EXAM:  There were no vitals taken for this visit.   Wt Readings from Last 3 Encounters:  06/13/22 195 lb (88.5 kg)  09/03/21 203 lb 12.8 oz (92.4 kg)  06/29/21 195 lb (88.5 kg)   Sept 2022 and 2023 visits were virtual. 07/2020 198# 3.2 oz 08/2019 192# 12.8 oz   General Appearance:   Alert, cooperative, no distress, appears stated age    Head:   Normocephalic, without obvious abnormality, atraumatic    Eyes:   PERRL, conjunctiva/corneas clear, EOM's intact, fundi benign    Ears:   Normal TM's and external ear canals    Nose:   No drainage or sinus tenderness  Throat:   Normal mucosa  Neck:   Supple, no lymphadenopathy; thyroid: no enlargement/tenderness/nodules; no carotid bruit or JVD    Back:   Spine nontender, no curvature, ROM normal, no CVA tenderness    Lungs:   Clear to auscultation bilaterally without wheezes, rales or ronchi; respirations unlabored    Chest Wall:   No tenderness or deformity    Heart:   Regular rate and rhythm, S1 and S2 normal, no murmur, rub or gallop    Breast Exam:  No chest wall tenderness, masses or gynecomastia   Abdomen:   Soft, non-tender, obese, nondistended, normoactive bowel sounds, no masses, no hepatosplenomegaly.  Genitalia:   Normal male external genitalia without lesions. Testicles without  masses. No inguinal hernias.    Rectal:   Normal sphincter tone, no masses or tenderness; guaiac negative stool. Prostate smooth, no nodules, normal size  Extremities:   No clubbing, cyanosis or edema    Pulses:   2+ and symmetric all extremities    Skin:   Skin color, texture, turgor normal, no rashes or lesions. No suspicious lesions  Lymph nodes:   Cervical, supraclavicular, and inguinal nodes normal    Neurologic:   Normal strength, sensation and gait; reflexes 2+ and symmetric throughout               Psych:  Normal mood, affect, hygiene and grooming, speech and eye contact    ASSESSMENT/PLAN:  VERIFY NOT WELCOME TO MEDICARE--HE IS 66 NOW, BUT PROB STILL WORKING AND ON SAME COMMERCIAL INSURANCE?? Start list of doctors, dep/fall screens. Can skip functional status, but do Mini-Cog  Enter/offer flu shot. Offer COVID booster, decline if refuses [id number]  (Don't give 3; likely had flu shot in October).  A1c with labs or separate? PSA, lipid (TG was elevated last year), cbc, c-met Thyroid only if sx  RF montelukast (Got lisinopril and HCTZ r/f in November)  Discussed PSA screening (risks/benefits), recommended at least 30 minutes of aerobic activity at least 5 days/week, weight-bearing exercise at least 2x/wk; proper sunscreen use reviewed; healthy diet and alcohol recommendations (less than or equal to 2 drinks/day) reviewed; regular seatbelt use; changing batteries in smoke detectors. Immunization recommendations discussed-- continue yearly flu shots. [id number]-48 next year (if covered by insurance if not on Medicare, otherwise pneumovax). COVID booster recommended but declined today. Colonoscopy recommendations reviewed, UTD.    F/u 1 year for CPE, sooner prn based on lab results (may need 6 month med check if A1c up) or if home BP's remain >130/80 consistently.

## 2022-09-13 NOTE — Patient Instructions (Incomplete)
  HEALTH MAINTENANCE RECOMMENDATIONS:  It is recommended that you get at least 30 minutes of aerobic exercise at least 5 days/week (for weight loss, you may need as much as 60-90 minutes). This can be any activity that gets your heart rate up. This can be divided in 10-15 minute intervals if needed, but try and build up your endurance at least once a week.  Weight bearing exercise is also recommended twice weekly.  Eat a healthy diet with lots of vegetables, fruits and fiber.  "Colorful" foods have a lot of vitamins (ie green vegetables, tomatoes, red peppers, etc).  Limit sweet tea, regular sodas and alcoholic beverages, all of which has a lot of calories and sugar.  Up to 2 alcoholic drinks daily may be beneficial for men (unless trying to lose weight, watch sugars).  Drink a lot of water.  Sunscreen of at least SPF 30 should be used on all sun-exposed parts of the skin when outside between the hours of 10 am and 4 pm (not just when at beach or pool, but even with exercise, golf, tennis, and yard work!)  Use a sunscreen that says "broad spectrum" so it covers both UVA and UVB rays, and make sure to reapply every 1-2 hours.  Remember to change the batteries in your smoke detectors when changing your clock times in the spring and fall.  Carbon monoxide detectors are recommended for your home.  Use your seat belt every time you are in a car, and please drive safely and not be distracted with cell phones and texting while driving.   You should consider getting the RSV vaccine, as we discussed. You'll need to get this from a pharmacy (we do not carry it).  This is a winter illness, so get it sooner rather than later, but wait at least 1-2 weeks separate from other vaccines. Also consider a COVID booster.  Please cut out the processed foods and meats. Limit soups, ramen, bratwurst and other salty foods. Try and cut back on the portions (bring leftovers home or don't finish all the food at  restaurants).  We discussed that alprazolam is NOT recommended for sleep. Occasionally using it for travel has worked for you, but I'd like for you not to rely on this as much. Consider melatonin and other relaxation techniques. See the information on the handout.  Try and not take the xanax when home, use the melatonin (that previously worked fine for you).  Please bring [place] copies of your Living Will and Healthcare Power of Attorney so that it can be scanned into your medical chart.  Please be sure to contact us if you feel you need to take the z-pak antibiotic.  I want to make sure that it is being used appropriately, or else it will stop working.  Please write down your blood pressures and bring the list to your follow-up in 3 months. Goal is <130/80. Please work on trying to get the weight back down, as we discussed.

## 2022-09-16 ENCOUNTER — Encounter: Payer: Self-pay | Admitting: Family Medicine

## 2022-09-16 ENCOUNTER — Ambulatory Visit (INDEPENDENT_AMBULATORY_CARE_PROVIDER_SITE_OTHER): Payer: BC Managed Care – PPO | Admitting: Family Medicine

## 2022-09-16 VITALS — BP 136/80 | HR 76 | Ht 69.5 in | Wt 212.6 lb

## 2022-09-16 DIAGNOSIS — Z0000 Encounter for general adult medical examination without abnormal findings: Secondary | ICD-10-CM | POA: Diagnosis not present

## 2022-09-16 DIAGNOSIS — Z23 Encounter for immunization: Secondary | ICD-10-CM | POA: Diagnosis not present

## 2022-09-16 DIAGNOSIS — I10 Essential (primary) hypertension: Secondary | ICD-10-CM | POA: Diagnosis not present

## 2022-09-16 DIAGNOSIS — E782 Mixed hyperlipidemia: Secondary | ICD-10-CM | POA: Diagnosis not present

## 2022-09-16 DIAGNOSIS — R7301 Impaired fasting glucose: Secondary | ICD-10-CM

## 2022-09-16 DIAGNOSIS — R635 Abnormal weight gain: Secondary | ICD-10-CM | POA: Diagnosis not present

## 2022-09-16 DIAGNOSIS — J301 Allergic rhinitis due to pollen: Secondary | ICD-10-CM

## 2022-09-16 DIAGNOSIS — Z125 Encounter for screening for malignant neoplasm of prostate: Secondary | ICD-10-CM | POA: Diagnosis not present

## 2022-09-16 DIAGNOSIS — G4700 Insomnia, unspecified: Secondary | ICD-10-CM

## 2022-09-16 DIAGNOSIS — F411 Generalized anxiety disorder: Secondary | ICD-10-CM

## 2022-09-16 LAB — POCT GLYCOSYLATED HEMOGLOBIN (HGB A1C): Hemoglobin A1C: 5.7 % — AB (ref 4.0–5.6)

## 2022-09-16 MED ORDER — AZITHROMYCIN 250 MG PO TABS: ORAL_TABLET | ORAL | 0 refills | Status: DC

## 2022-09-16 MED ORDER — MONTELUKAST SODIUM 10 MG PO TABS: ORAL_TABLET | ORAL | 3 refills | Status: DC

## 2022-09-16 MED ORDER — ALPRAZOLAM 1 MG PO TABS: 0.5000 mg | ORAL_TABLET | Freq: Every evening | ORAL | 0 refills | Status: DC | PRN

## 2022-09-17 LAB — CBC WITH DIFFERENTIAL/PLATELET
Basophils Absolute: 0.1 10*3/uL (ref 0.0–0.2)
Basos: 1 %
EOS (ABSOLUTE): 0.1 10*3/uL (ref 0.0–0.4)
Eos: 1 %
Hematocrit: 46.6 % (ref 37.5–51.0)
Hemoglobin: 16 g/dL (ref 13.0–17.7)
Immature Grans (Abs): 0 10*3/uL (ref 0.0–0.1)
Immature Granulocytes: 1 %
Lymphocytes Absolute: 2.2 10*3/uL (ref 0.7–3.1)
Lymphs: 33 %
MCH: 30.2 pg (ref 26.6–33.0)
MCHC: 34.3 g/dL (ref 31.5–35.7)
MCV: 88 fL (ref 79–97)
Monocytes Absolute: 0.6 10*3/uL (ref 0.1–0.9)
Monocytes: 9 %
Neutrophils Absolute: 3.8 10*3/uL (ref 1.4–7.0)
Neutrophils: 55 %
Platelets: 260 10*3/uL (ref 150–450)
RBC: 5.29 x10E6/uL (ref 4.14–5.80)
RDW: 12.3 % (ref 11.6–15.4)
WBC: 6.8 10*3/uL (ref 3.4–10.8)

## 2022-09-17 LAB — COMPREHENSIVE METABOLIC PANEL
ALT: 40 IU/L (ref 0–44)
AST: 26 IU/L (ref 0–40)
Albumin/Globulin Ratio: 1.6 (ref 1.2–2.2)
Albumin: 4.6 g/dL (ref 3.9–4.9)
Alkaline Phosphatase: 108 IU/L (ref 44–121)
BUN/Creatinine Ratio: 13 (ref 10–24)
BUN: 10 mg/dL (ref 8–27)
Bilirubin Total: 0.3 mg/dL (ref 0.0–1.2)
CO2: 21 mmol/L (ref 20–29)
Calcium: 10.3 mg/dL — ABNORMAL HIGH (ref 8.6–10.2)
Chloride: 101 mmol/L (ref 96–106)
Creatinine, Ser: 0.77 mg/dL (ref 0.76–1.27)
Globulin, Total: 2.9 g/dL (ref 1.5–4.5)
Glucose: 106 mg/dL — ABNORMAL HIGH (ref 70–99)
Potassium: 3.9 mmol/L (ref 3.5–5.2)
Sodium: 140 mmol/L (ref 134–144)
Total Protein: 7.5 g/dL (ref 6.0–8.5)
eGFR: 99 mL/min/{1.73_m2} (ref >59)

## 2022-09-17 LAB — PSA: Prostate Specific Ag, Serum: 0.8 ng/mL (ref 0.0–4.0)

## 2022-09-17 LAB — LIPID PANEL
Chol/HDL Ratio: 3.5 ratio (ref 0.0–5.0)
Cholesterol, Total: 176 mg/dL (ref 100–199)
HDL: 50 mg/dL (ref >39)
LDL Chol Calc (NIH): 87 mg/dL (ref 0–99)
Triglycerides: 234 mg/dL — ABNORMAL HIGH (ref 0–149)
VLDL Cholesterol Cal: 39 mg/dL (ref 5–40)

## 2022-09-17 LAB — TSH: TSH: 1.46 u[IU]/mL (ref 0.450–4.500)

## 2022-11-15 ENCOUNTER — Other Ambulatory Visit: Payer: Self-pay | Admitting: Family Medicine

## 2022-11-15 DIAGNOSIS — I10 Essential (primary) hypertension: Secondary | ICD-10-CM

## 2022-11-15 NOTE — Telephone Encounter (Signed)
Has an appt in april

## 2022-12-16 ENCOUNTER — Other Ambulatory Visit: Payer: Self-pay | Admitting: Family Medicine

## 2022-12-16 DIAGNOSIS — B001 Herpesviral vesicular dermatitis: Secondary | ICD-10-CM

## 2022-12-17 MED ORDER — VALACYCLOVIR HCL 1 G PO TABS: ORAL_TABLET | ORAL | 0 refills | Status: DC

## 2022-12-17 NOTE — Telephone Encounter (Signed)
Patient requesting refill. 

## 2022-12-25 ENCOUNTER — Other Ambulatory Visit: Payer: Self-pay | Admitting: Family Medicine

## 2022-12-25 DIAGNOSIS — B001 Herpesviral vesicular dermatitis: Secondary | ICD-10-CM

## 2023-01-01 ENCOUNTER — Telehealth: Payer: Self-pay | Admitting: *Deleted

## 2023-01-01 NOTE — Telephone Encounter (Signed)
Patient has med check tomorrow, he wants to know if he needs to fast.

## 2023-01-01 NOTE — Progress Notes (Unsigned)
No chief complaint on file.  Patient presents for 3 month follow-up on his hypertension. He reports compliance with lisinopril 20mg  and HCTZ 12.5 mg. BP's had been above goal when last discussed at his physical in December, and readings had been borderline at home. Declined increase in HCTZ, preferring  to work on diet/exercise and Na intake. In December, it was noted that he had gained weight (holidays, parties, traveling, bour[place] tours).  Fglu was elevated at 106, A1c was 5.7%, and TG were elevated at 234.  Since last visit  Omega 3?  BP's are running   BP Readings from Last 3 Encounters:  09/16/22 136/80  06/13/22 135/74  09/03/21 (!) 150/80      PHYSICAL EXAM:  There were no vitals taken for this visit.  Wt Readings from Last 3 Encounters:  09/16/22 212 lb 9.6 oz (96.4 kg)  06/13/22 195 lb (88.5 kg)  09/03/21 203 lb 12.8 oz (92.4 kg)      ASSESSMENT/PLAN:  Add omega-3 fish oil if he started taking this (rec due to elevated TG)  Offer/decline COVID booster  Increase HCTZ to 25mg  if BP's above goal

## 2023-01-01 NOTE — Telephone Encounter (Signed)
He does not need to fast.

## 2023-01-02 ENCOUNTER — Ambulatory Visit (INDEPENDENT_AMBULATORY_CARE_PROVIDER_SITE_OTHER): Payer: BC Managed Care – PPO | Admitting: Family Medicine

## 2023-01-02 ENCOUNTER — Encounter: Payer: Self-pay | Admitting: Family Medicine

## 2023-01-02 VITALS — BP 130/70 | HR 76 | Ht 69.5 in | Wt 211.0 lb

## 2023-01-02 DIAGNOSIS — Z6830 Body mass index (BMI) 30.0-30.9, adult: Secondary | ICD-10-CM

## 2023-01-02 DIAGNOSIS — E781 Pure hyperglyceridemia: Secondary | ICD-10-CM

## 2023-01-02 DIAGNOSIS — R7301 Impaired fasting glucose: Secondary | ICD-10-CM | POA: Diagnosis not present

## 2023-01-02 DIAGNOSIS — I10 Essential (primary) hypertension: Secondary | ICD-10-CM | POA: Diagnosis not present

## 2023-01-02 MED ORDER — LISINOPRIL-HYDROCHLOROTHIAZIDE 20-12.5 MG PO TABS: 1.0000 | ORAL_TABLET | Freq: Every day | ORAL | 3 refills | Status: DC

## 2023-01-02 NOTE — Patient Instructions (Addendum)
  Please increase your omega-3 fish oil to 3000 mg daily. This should help keep the triglycerides down (as well as cutting back on fried/fatty foods, alcohol, sugar and sweets).  I think your technique with your wrist monitor may be a factor in why your blood pressure is much higher at home.  It is fine here in the office and at the [employer].  Be sure to try and keep your wrist at heart level when you check your blood pressure. Bring in your monitor for [place] to recheck it at your next visit, or you can do this at a nurse visit anytime.  Goal blood pressure is <130/80. Daily exercise, weight loss and low sodium diet all help keep the blood pressure down.  We switched your blood pressure medication to be a combination of the 2 separate pills. There was no change in the dose.  Use up what you have, then switch to the combo (you said you were about a week off between the pills, so you will have some leftover medication once you switch to the combo).  Try and cut out the 1/2 and 1/2 tea (stick with unsweet) and regular soda. Continue to drink plenty of water.  COVID booster is recommended.

## 2023-02-12 ENCOUNTER — Other Ambulatory Visit: Payer: Self-pay | Admitting: Family Medicine

## 2023-02-12 DIAGNOSIS — I10 Essential (primary) hypertension: Secondary | ICD-10-CM

## 2023-04-23 DIAGNOSIS — H2513 Age-related nuclear cataract, bilateral: Secondary | ICD-10-CM | POA: Diagnosis not present

## 2023-04-23 DIAGNOSIS — H5203 Hypermetropia, bilateral: Secondary | ICD-10-CM | POA: Diagnosis not present

## 2023-05-19 ENCOUNTER — Other Ambulatory Visit: Payer: Self-pay | Admitting: Family Medicine

## 2023-05-19 DIAGNOSIS — F411 Generalized anxiety disorder: Secondary | ICD-10-CM

## 2023-05-19 MED ORDER — ALPRAZOLAM 1 MG PO TABS
0.5000 mg | ORAL_TABLET | Freq: Every evening | ORAL | 0 refills | Status: DC | PRN
Start: 2023-05-19 — End: 2023-11-17

## 2023-05-19 NOTE — Telephone Encounter (Signed)
Is this okay to refill? He is leaving for a cruise Thursday and wants to have some on hand. He knows you do not have any access today.

## 2023-06-16 ENCOUNTER — Other Ambulatory Visit: Payer: Self-pay | Admitting: Family Medicine

## 2023-06-16 DIAGNOSIS — J301 Allergic rhinitis due to pollen: Secondary | ICD-10-CM

## 2023-07-15 ENCOUNTER — Other Ambulatory Visit: Payer: Self-pay | Admitting: Family Medicine

## 2023-07-15 DIAGNOSIS — B001 Herpesviral vesicular dermatitis: Secondary | ICD-10-CM

## 2023-07-15 MED ORDER — VALACYCLOVIR HCL 1 G PO TABS
ORAL_TABLET | ORAL | 0 refills | Status: DC
Start: 2023-07-15 — End: 2024-02-20

## 2023-07-15 NOTE — Telephone Encounter (Signed)
Is this okay to refill? 

## 2023-07-17 ENCOUNTER — Other Ambulatory Visit (INDEPENDENT_AMBULATORY_CARE_PROVIDER_SITE_OTHER): Payer: BC Managed Care – PPO

## 2023-07-17 DIAGNOSIS — Z23 Encounter for immunization: Secondary | ICD-10-CM | POA: Diagnosis not present

## 2023-10-07 ENCOUNTER — Encounter: Payer: Self-pay | Admitting: *Deleted

## 2023-10-07 NOTE — Progress Notes (Signed)
 Chief Complaint  Patient presents with   Annual Exam    Fasting annual exam, no concerns. Declined covid booster and did not yet get RSV vaccine. Requesting refill on xanax .    Tyler Martin is a 68 y.o. male who presents for a complete physical and follow-up on chronic problems.    Obesity:  He had lost weight doing [employer] program (completed in 07/2018), regained weight when he went back to traveling for work, holiday parties, and bourbon tours (with poor diet at the card games). Today he reports trying to eat healthy.  Fast food only with grandkids (2x/month). He is exercising regularly, and reports some weight loss.  Hypertension. He reports compliance with lisinopril  HCT 20-12.5 mg. We had felt that his technique with home BP's (arm position) contributed to higher levels at home, compared to BP's in the office and at [employer]. He still keeps his arm too low, but states that BP's have been good, similar to here.  BP's at [facility] are also good.  BP Readings from Last 3 Encounters:  10/08/23 130/70  01/02/23 130/70  09/16/22 136/80   Denies headache, dizziness, cough, edema, muscle cramps, chest pain.  He is trying to limit sodium in his diet. Admits to snacking on pickles.  Impaired fasting glucose--Fasting sugars have been elevated, 106 in 08/2022 (up to 117 in 2018).  Last A1c was 5.7%.  He usually does unsweetened tea with artificial sweetener. Sometimes does 1/2 and 1/2 tea. In April he reported that he cut back some on drinking (golf trips and games/sports), not having alcohol daily. Having 2-3 drinks at a time, 2x/week. This is unchanged.  TG were elevated at 234. He was encouraged to take fish oil 3000 mg daily (had only been taking 1000 mg about 3x/week in April).  He reports he was taking just 1 fish oil daily, ran out about a month ago.  Herpes labialis--Uses Valtrex  prn with good results. Infrequent flares, related to travel, sun, stress. Last refilled in 07/2023.  He sees a connection with eating shellfish too.  Allergies: He is taking singulair  and claritin (sometimes zyrtec, switches every few months), with prn use of benadryl  with flares. (He previously used Flonase , stopped due to getting bumps in his nose, and no longer needs it.)    Anxiety/insomnia:  He uses 1/2 tablet of alprazolam  intermittently to help with sleep, usually related to travel.  Last filled #30 in 05/2023, prior fill was 08/2022. He requested a refill from the nurse, but states that he has about 12 pills left at home from August (and uses only 1/2 pill at a time).    Immunization History  Administered Date(s) Administered   Fluad Trivalent(High Dose 65+) 07/17/2023   Influenza Split 06/11/2012   Influenza, High Dose Seasonal PF 07/01/2018, 07/29/2022   Influenza,inj,Quad PF,6+ Mos 06/02/2013, 06/02/2014, 06/07/2015, 06/05/2016, 06/30/2017, 07/15/2019, 07/11/2020, 07/10/2021   Moderna Sars-Covid-2 Vaccination 12/02/2019, 05/08/2020   PNEUMOCOCCAL CONJUGATE-20 09/16/2022   Tdap 08/30/2010, 08/28/2020   Zoster Recombinant(Shingrix ) 08/08/2017, 11/07/2017    Last colonoscopy: 10/2017, had tubular adenoma, diverticulosis. He was told to f/u in 7 years. Last PSA: Lab Results  Component Value Date   PSA1 0.8 09/16/2022   PSA1 1.0 09/03/2021   PSA1 1.0 08/28/2020   PSA 0.7 07/21/2017   PSA 0.6 06/05/2016   PSA 1.61 06/02/2014   Dentist: twice yearly   Ophtho: yearly   Exercise: [place] jog 2-3 miles at least 3x/week, treadmill when at the house (1 mile twice a  day, when done). Weights most days, and bands. Plays golf, walks the courses when he can.   Other doctors caring for patient: Derm--Dr. Joshua Dentist--Dr. Suellen Ophtho--Dr.McCuen GI--Dr. Rollin  He has a living will and healthcare power of attorney (not in chart)     12/13/2019    9:05 AM 09/03/2021    9:45 AM 09/16/2022    8:47 AM 10/08/2023    1:51 PM  Fall Risk  Falls in the past year?  1 1 0  Number of  falls in past year - Comments   09/07/22 while hunting fell in a brush pile   Was there an injury with Fall?  0 0 0  Was there an injury with Fall? - Comments  scrapped up knee skinned right knee   Fall Risk Category Calculator  2 1 0  Fall Risk Category (Retired)  Moderate Low   (RETIRED) Patient Fall Risk Level Low fall risk Low fall risk Low fall risk   Patient at Risk for Falls Due to  History of fall(s) History of fall(s) No Fall Risks  Fall risk Follow up  Falls evaluation completed Falls evaluation completed Falls evaluation completed      10/08/2023    1:52 PM 09/16/2022    8:47 AM 09/03/2021    9:46 AM 08/28/2020    8:47 AM 08/02/2019    2:52 PM  Depression screen PHQ 2/9  Decreased Interest 0 0 0 0 0  Down, Depressed, Hopeless 0 0 0 0   PHQ - 2 Score 0 0 0 0 0     PMH, PSH, SH and FH were reviewed and updated.  Outpatient Encounter Medications as of 10/08/2023  Medication Sig Note   lisinopril -hydrochlorothiazide  (ZESTORETIC ) 20-12.5 MG tablet Take 1 tablet by mouth daily.    loratadine (CLARITIN) 10 MG tablet Take 10 mg by mouth daily.    montelukast  (SINGULAIR ) 10 MG tablet TAKE 1 TABLET BY MOUTH EVERYDAY AT BEDTIME    Multiple Vitamins-Minerals (MULTIVITAMIN WITH MINERALS) tablet Take 1 tablet by mouth daily.    Zinc 50 MG TABS Take 1 tablet by mouth daily.    [DISCONTINUED] cholecalciferol (VITAMIN D3) 25 MCG (1000 UNIT) tablet Take 1,000 Units by mouth daily.    ALPRAZolam  (XANAX ) 1 MG tablet Take 0.5-1 tablets (0.5-1 mg total) by mouth at bedtime as needed for sleep. (Patient not taking: Reported on 10/08/2023) 10/08/2023: As needed   hydrocortisone 2.5 % ointment APPLY TO AFFECTED AREA TWICE A DAY (Patient not taking: Reported on 10/08/2023) 10/08/2023: Uses prn for rashes   ibuprofen (ADVIL) 200 MG tablet Take 200 mg by mouth every 6 (six) hours as needed. (Patient not taking: Reported on 10/08/2023) 10/08/2023: As needed   Omega 3 1000 MG CAPS Take 1 capsule by mouth daily.  (Patient not taking: Reported on 10/08/2023) 10/08/2023: Stopped when he ran out about a month ago   valACYclovir  (VALTREX ) 1000 MG tablet TAKE 2 TABLETS AT ONSET OF COLD SORE. REPEAT ONCE IN 12 HOURS (TOTAL OF 4 TABLETS PER COURSE) (Patient not taking: Reported on 10/08/2023) 10/08/2023: As needed   [DISCONTINUED] aspirin-acetaminophen-caffeine (EXCEDRIN MIGRAINE) 250-250-65 MG tablet Take 2 tablets by mouth every 6 (six) hours as needed for headache. (Patient not taking: Reported on 10/08/2023) 01/02/2023: Took 2 this am   [DISCONTINUED] Zinc 100 MG TABS Take 200 mg by mouth.    No facility-administered encounter medications on file as of 10/08/2023.   No Known Allergies   ROS: The patient denies anorexia, fever, headaches, vision  loss, decreased hearing, ear pain, hoarseness, chest pain, palpitations, dizziness, syncope, dyspnea on exertion, cough, swelling, nausea, vomiting, diarrhea, constipation, abdominal pain, melena, hematochezia, indigestion/heartburn, hematuria, incontinence, erectile dysfunction, nocturia, weakened urine stream, dysuria, genital lesions, joint pains, numbness, tingling, weakness, tremor, suspicious skin lesions, depression, abnormal bleeding/bruising, or enlarged lymph nodes.   Anxiety/sleep issues per HPI Sees Dr. Joshua regularly (derm). Allergies are controlled on current meds, occasional needs some benadryl .      PHYSICAL EXAM:  BP 130/70   Pulse 72   Ht 5' 9.5 (1.765 m)   Wt 208 lb (94.3 kg)   BMI 30.28 kg/m   Wt Readings from Last 3 Encounters:  10/08/23 208 lb (94.3 kg)  01/02/23 211 lb (95.7 kg)  09/16/22 212 lb 9.6 oz (96.4 kg)   08/2021 203 lb 12.8 oz  07/2020 198# 3.2 oz 08/2019 192# 12.8 oz   General Appearance:   Alert, cooperative, no distress, appears stated age    Head:   Normocephalic, without obvious abnormality, atraumatic    Eyes:   PERRL, conjunctiva/corneas clear, EOM's intact, fundi benign    Ears:   Normal TM and external ear canal on the  L, right was limited by cerumen  Nose:   No drainage or sinus tenderness  Throat:   Normal mucosa  Neck:   Supple, no lymphadenopathy; thyroid: no enlargement/tenderness/nodules; no carotid bruit or JVD    Back:   Spine nontender, no curvature, ROM normal, no CVA tenderness    Lungs:   Clear to auscultation bilaterally without wheezes, rales or ronchi; respirations unlabored    Chest Wall:   No tenderness or deformity    Heart:   Regular rate and rhythm, S1 and S2 normal, no murmur, rub or gallop    Breast Exam:  No chest wall tenderness, masses or gynecomastia   Abdomen:   Soft, non-tender, obese, nondistended, normoactive bowel sounds, no masses, no hepatosplenomegaly.  Genitalia:   Normal male external genitalia without lesions. Testicles without masses. No inguinal hernias.    Rectal:   Declined exam today  Extremities:   No clubbing, cyanosis or edema    Pulses:   2+ and symmetric all extremities    Skin:   Skin color, texture, turgor normal, no rashes or lesions. No suspicious lesions  Lymph nodes:   Cervical, supraclavicular, and inguinal nodes normal    Neurologic:   Normal strength, sensation and gait; reflexes 2+ and symmetric throughout               Psych:  Normal mood, affect, hygiene and grooming, speech and eye contact   Lab Results  Component Value Date   HGBA1C 5.4 10/08/2023     ASSESSMENT/PLAN:   Annual physical exam - Plan: PSA, Lipid panel, CMP14+EGFR, HgB A1c  Allergic rhinitis due to pollen, unspecified seasonality - Cont antihistamine, montelukast , controlled.  Essential hypertension, benign - well controlled, continue lisinopril  HCT. Reviewed low sodium diet, recs for additional weight loss - Plan: CMP14+EGFR  Impaired fasting glucose - reviewed proper diet, continue daily exercise and weight loss - Plan: CMP14+EGFR, HgB A1c  Hypertriglyceridemia - reviewed proper diet. Due for recheck. To restart fish oil (increase to 3000 mg daily if TG high) - Plan:  Lipid panel  Screening for prostate cancer - Plan: PSA  Class 1 obesity due to excess calories with serious comorbidity and body mass index (BMI) of 30.0 to 30.9 in adult - discussed risks, proper diet, exercise, wt loss.  comorbidities include pre-DM, HTN, HLD  Discussed PSA screening (risks/benefits), recommended at least 30 minutes of aerobic activity at least 5 days/week, weight-bearing exercise at least 2x/wk; proper sunscreen use reviewed; healthy diet and alcohol recommendations (less than or equal to 2 drinks/day) reviewed; regular seatbelt use; changing batteries in smoke detectors. Immunization recommendations discussed-- continue yearly flu shots.  Declined COVID booster. Discussed RSV, can wait. Colonoscopy recommendations reviewed, UTD.   F/u 1 year for CPE, sooner prn, based on lab results.

## 2023-10-07 NOTE — Patient Instructions (Addendum)
  HEALTH MAINTENANCE RECOMMENDATIONS:  It is recommended that you get at least 30 minutes of aerobic exercise at least 5 days/week (for weight loss, you may need as much as 60-90 minutes). This can be any activity that gets your heart rate up. This can be divided in 10-15 minute intervals if needed, but try and build up your endurance at least once a week.  Weight bearing exercise is also recommended twice weekly.  Eat a healthy diet with lots of vegetables, fruits and fiber.  Colorful foods have a lot of vitamins (ie green vegetables, tomatoes, red peppers, etc).  Limit sweet tea, regular sodas and alcoholic beverages, all of which has a lot of calories and sugar.  Up to 2 alcoholic drinks daily may be beneficial for men (unless trying to lose weight, watch sugars).  Drink a lot of water.  Sunscreen of at least SPF 30 should be used on all sun-exposed parts of the skin when outside between the hours of 10 am and 4 pm (not just when at beach or pool, but even with exercise, golf, tennis, and yard work!)  Use a sunscreen that says broad spectrum so it covers both UVA and UVB rays, and make sure to reapply every 1-2 hours.  Remember to change the batteries in your smoke detectors when changing your clock times in the spring and fall.  Carbon monoxide detectors are recommended for your home.  Use your seat belt every time you are in a car, and please drive safely and not be distracted with cell phones and texting while driving.   We discussed RSV vaccine--given that you are at low risk, you likely can wait longer to get this. COVID booster is recommended.  There is a newer one, that is not an mRNA vaccine, called Novavax, if you are interested (we don't carry it, you can ask the pharmacy).  You likely need to get more fish oil. If your triglycerides are over 250, you should take 3000 mg daily, rather than just 1 pill/day. 1 pill/day is fine if around 200 or less, as long as you are also limiting  your sweets/sugar/fried foods.  Please bring us  copies of your Living Will and Healthcare Power of Attorney so that it can be scanned into your medical chart.

## 2023-10-08 ENCOUNTER — Encounter: Payer: Self-pay | Admitting: Family Medicine

## 2023-10-08 ENCOUNTER — Ambulatory Visit (INDEPENDENT_AMBULATORY_CARE_PROVIDER_SITE_OTHER): Payer: BC Managed Care – PPO | Admitting: Family Medicine

## 2023-10-08 VITALS — BP 130/70 | HR 72 | Ht 69.5 in | Wt 208.0 lb

## 2023-10-08 DIAGNOSIS — Z6830 Body mass index (BMI) 30.0-30.9, adult: Secondary | ICD-10-CM

## 2023-10-08 DIAGNOSIS — J301 Allergic rhinitis due to pollen: Secondary | ICD-10-CM | POA: Diagnosis not present

## 2023-10-08 DIAGNOSIS — R7301 Impaired fasting glucose: Secondary | ICD-10-CM

## 2023-10-08 DIAGNOSIS — E6609 Other obesity due to excess calories: Secondary | ICD-10-CM | POA: Diagnosis not present

## 2023-10-08 DIAGNOSIS — Z0000 Encounter for general adult medical examination without abnormal findings: Secondary | ICD-10-CM

## 2023-10-08 DIAGNOSIS — I10 Essential (primary) hypertension: Secondary | ICD-10-CM

## 2023-10-08 DIAGNOSIS — E781 Pure hyperglyceridemia: Secondary | ICD-10-CM | POA: Diagnosis not present

## 2023-10-08 DIAGNOSIS — Z125 Encounter for screening for malignant neoplasm of prostate: Secondary | ICD-10-CM | POA: Diagnosis not present

## 2023-10-08 DIAGNOSIS — E66811 Obesity, class 1: Secondary | ICD-10-CM

## 2023-10-08 LAB — POCT GLYCOSYLATED HEMOGLOBIN (HGB A1C): Hemoglobin A1C: 5.4 % (ref 4.0–5.6)

## 2023-10-09 ENCOUNTER — Encounter: Payer: Self-pay | Admitting: Family Medicine

## 2023-10-09 LAB — LIPID PANEL
Chol/HDL Ratio: 3.8 {ratio} (ref 0.0–5.0)
Cholesterol, Total: 176 mg/dL (ref 100–199)
HDL: 46 mg/dL (ref >39)
LDL Chol Calc (NIH): 104 mg/dL — ABNORMAL HIGH (ref 0–99)
Triglycerides: 149 mg/dL (ref 0–149)
VLDL Cholesterol Cal: 26 mg/dL (ref 5–40)

## 2023-10-09 LAB — CMP14+EGFR
ALT: 32 [IU]/L (ref 0–44)
AST: 22 [IU]/L (ref 0–40)
Albumin: 4.6 g/dL (ref 3.9–4.9)
Alkaline Phosphatase: 94 [IU]/L (ref 44–121)
BUN/Creatinine Ratio: 14 (ref 10–24)
BUN: 12 mg/dL (ref 8–27)
Bilirubin Total: 0.8 mg/dL (ref 0.0–1.2)
CO2: 24 mmol/L (ref 20–29)
Calcium: 10 mg/dL (ref 8.6–10.2)
Chloride: 98 mmol/L (ref 96–106)
Creatinine, Ser: 0.87 mg/dL (ref 0.76–1.27)
Globulin, Total: 2.9 g/dL (ref 1.5–4.5)
Glucose: 80 mg/dL (ref 70–99)
Potassium: 3.9 mmol/L (ref 3.5–5.2)
Sodium: 139 mmol/L (ref 134–144)
Total Protein: 7.5 g/dL (ref 6.0–8.5)
eGFR: 95 mL/min/{1.73_m2} (ref >59)

## 2023-10-09 LAB — PSA: Prostate Specific Ag, Serum: 1 ng/mL (ref 0.0–4.0)

## 2023-11-17 ENCOUNTER — Other Ambulatory Visit: Payer: Self-pay | Admitting: Family Medicine

## 2023-11-17 DIAGNOSIS — F411 Generalized anxiety disorder: Secondary | ICD-10-CM

## 2023-11-18 MED ORDER — ALPRAZOLAM 1 MG PO TABS: 0.5000 mg | ORAL_TABLET | Freq: Every evening | ORAL | 0 refills | Status: DC | PRN

## 2023-11-18 NOTE — Telephone Encounter (Signed)
 Is this okay to refill?

## 2023-12-17 ENCOUNTER — Other Ambulatory Visit: Payer: Self-pay | Admitting: Family Medicine

## 2023-12-17 DIAGNOSIS — I10 Essential (primary) hypertension: Secondary | ICD-10-CM

## 2023-12-22 ENCOUNTER — Encounter: Payer: Self-pay | Admitting: Family Medicine

## 2024-02-18 ENCOUNTER — Encounter: Payer: Self-pay | Admitting: Family Medicine

## 2024-02-19 ENCOUNTER — Other Ambulatory Visit: Payer: Self-pay | Admitting: *Deleted

## 2024-02-19 ENCOUNTER — Other Ambulatory Visit: Payer: Self-pay | Admitting: Family Medicine

## 2024-02-19 DIAGNOSIS — J301 Allergic rhinitis due to pollen: Secondary | ICD-10-CM

## 2024-02-19 DIAGNOSIS — B001 Herpesviral vesicular dermatitis: Secondary | ICD-10-CM

## 2024-02-19 MED ORDER — MONTELUKAST SODIUM 10 MG PO TABS: ORAL_TABLET | ORAL | 1 refills | Status: DC

## 2024-04-05 DIAGNOSIS — L821 Other seborrheic keratosis: Secondary | ICD-10-CM | POA: Diagnosis not present

## 2024-04-05 DIAGNOSIS — Z85828 Personal history of other malignant neoplasm of skin: Secondary | ICD-10-CM | POA: Diagnosis not present

## 2024-04-14 ENCOUNTER — Other Ambulatory Visit: Payer: Self-pay | Admitting: Family Medicine

## 2024-04-14 DIAGNOSIS — F411 Generalized anxiety disorder: Secondary | ICD-10-CM

## 2024-04-14 MED ORDER — ALPRAZOLAM 1 MG PO TABS: 0.5000 mg | ORAL_TABLET | Freq: Every evening | ORAL | 0 refills | Status: DC | PRN

## 2024-04-14 NOTE — Telephone Encounter (Signed)
 Copied from CRM [id number]. Topic: Clinical - Medication Refill >> Apr 14, 2024  3:11 PM Fredrica W wrote: Medication: ALPRAZolam  (XANAX ) 1 MG tablet   Has the patient contacted their pharmacy? Yes (Agent: If no, request that the patient contact the pharmacy for the refill. If patient does not wish to contact the pharmacy document the reason why and proceed with request.) (Agent: If yes, when and what did the pharmacy advise?)  This is the patient's preferred pharmacy:  [facility] [phone] [address], [place] - [address] [place] [address] Phone: [phone] Fax: [phone]   Is this the correct pharmacy for this prescription? Yes If no, delete pharmacy and type the correct one.   Has the prescription been filled recently? No  Is the patient out of the medication? No - leaving Saturday for extended trip   Has the patient been seen for an appointment in the last year OR does the patient have an upcoming appointment? Yes 10/2023  Can we respond through MyChart? Yes  Agent: Please be advised that Rx refills may take up to 3 business days. We ask that you follow-up with your pharmacy.

## 2024-04-14 NOTE — Telephone Encounter (Signed)
 Is this okay to refill?

## 2024-04-30 DIAGNOSIS — H5203 Hypermetropia, bilateral: Secondary | ICD-10-CM | POA: Diagnosis not present

## 2024-04-30 DIAGNOSIS — H2513 Age-related nuclear cataract, bilateral: Secondary | ICD-10-CM | POA: Diagnosis not present

## 2024-07-13 ENCOUNTER — Ambulatory Visit

## 2024-07-18 ENCOUNTER — Other Ambulatory Visit: Payer: Self-pay | Admitting: Family Medicine

## 2024-07-18 DIAGNOSIS — F411 Generalized anxiety disorder: Secondary | ICD-10-CM

## 2024-07-19 NOTE — Telephone Encounter (Signed)
 I called Patient, He stated that he was planning ahead of time with plans of going out of town. He is not out, he appreciates the concern. He said he appreciates you and all of the staff here.

## 2024-07-19 NOTE — Telephone Encounter (Signed)
 PDMP reviewed--this was last filled in July. He usually has 4-6 months in between refills of alprazolam . Let him know that the refill was sent, but that it was noted he is using it more frequently than in the past.  Remind him to work on using other measures to treat insomnia/anxiety/stress. If any worsening issues, recommend appointment to further discuss.  (Please thank him for the gift cards--not distributed yet, to be given on Thursday at staff meeting).

## 2024-08-03 ENCOUNTER — Other Ambulatory Visit: Payer: Self-pay | Admitting: Family Medicine

## 2024-08-03 DIAGNOSIS — J301 Allergic rhinitis due to pollen: Secondary | ICD-10-CM

## 2024-09-07 ENCOUNTER — Other Ambulatory Visit: Payer: Self-pay | Admitting: Family Medicine

## 2024-09-07 DIAGNOSIS — I10 Essential (primary) hypertension: Secondary | ICD-10-CM

## 2024-10-27 NOTE — Patient Instructions (Incomplete)
" °  HEALTH MAINTENANCE RECOMMENDATIONS:  It is recommended that you get at least 30 minutes of aerobic exercise at least 5 days/week (for weight loss, you may need as much as 60-90 minutes). This can be any activity that gets your heart rate up. This can be divided in 10-15 minute intervals if needed, but try and build up your endurance at least once a week.  Weight bearing exercise is also recommended twice weekly.  Eat a healthy diet with lots of vegetables, fruits and fiber.  Colorful foods have a lot of vitamins (ie green vegetables, tomatoes, red peppers, etc).  Limit sweet tea, regular sodas and alcoholic beverages, all of which has a lot of calories and sugar.  Up to 2 alcoholic drinks daily may be beneficial for men (unless trying to lose weight, watch sugars).  Drink a lot of water.  Sunscreen of at least SPF 30 should be used on all sun-exposed parts of the skin when outside between the hours of 10 am and 4 pm (not just when at beach or pool, but even with exercise, golf, tennis, and yard work!)  Use a sunscreen that says broad spectrum so it covers both UVA and UVB rays, and make sure to reapply every 1-2 hours.  Remember to change the batteries in your smoke detectors when changing your clock times in the spring and fall.  Carbon monoxide detectors are recommended for your home.  Use your seat belt every time you are in a car, and please drive safely and not be distracted with cell phones and texting while driving.  Use the montelukast  daily--that's how it works best for prevention.  You can use it seasonally if you prefer not to use it year-round (if allergies are only in the spring and/or fall).   Your blood pressure was borderline high at your visit, likely related to your recent soup intake. The last 4 values of blood pressures at the [facility] were all over 140 systolic, all too high. Please monitor regularly at home, if it is high, relax and recheck in 5 minutes. If home BP's  remain consistently 135-140/85-90, then medication adjustments are needed. Daily exercise and weight loss will help lower the blood pressure.  Please bring us  copies of your Living Will and Healthcare Power of Attorney so that it can be scanned into your medical chart.  "

## 2024-10-27 NOTE — Progress Notes (Signed)
 " Chief Complaint  Patient presents with   Annual Exam    Cpe.  No questions or concerns. Confirmed he is not medicare.  Confirmed he does not want any covid boosters. Did get flu shot in October. Did not see Dr. Rollin in December will see him on Feb 2nd. Scheduling colonoscopy on feb 2nd as well.    Tyler Martin is a 69 y.o. male who presents for a complete physical and follow-up on chronic problems.   He notes some decrease in libido.  Denies ED or significant fatigue.  He has just been very busy.  Some work-related stress that he can't share with his family, wonders if that contributes.  Obesity:  He had previously lost weight doing [employer] program (completed in 07/2018), regained weight when he went back to traveling for work, holiday parties, and bourbon tours (with poor diet at the card games). He continues to try and eat healthy foods. He reports traveling a lot recently (work and pleasure), with more trips planned.  Wt Readings from Last 3 Encounters:  10/28/24 212 lb 3.2 oz (96.3 kg)  10/08/23 208 lb (94.3 kg)  01/02/23 211 lb (95.7 kg)    Hypertension. He reports compliance with lisinopril  HCT 20-12.5 mg. He is trying to limit sodium in his diet. He cut back on pickles. Having more soups since the cold weather. BP's have been running 135/75 at home. BP has been 142-155/70-78 at [employer] over the last 3-4 donations.  BP Readings from Last 3 Encounters:  10/28/24 138/88  10/08/23 130/70  01/02/23 130/70   Denies headache, dizziness, cough, edema, muscle cramps, chest pain.     Impaired fasting glucose--Fasting sugars have been elevated over the last several years, but was normal at 80 last year. A1c was normal at 5.4%, last year, max of 5.8 in 08/2021.  He usually does unsweetened tea with artificial sweetener. Sometimes does 1/2 and 1/2 tea. He no longer has alcohol daily.  Has 2-3 drinks at a time, about 2x/week.  Component Ref Range & Units (hover) 1 yr ago 2 yr  ago 3 yr ago 4 yr ago 5 yr ago 6 yr ago 7 yr ago  Glucose 80 106 High  107 High  115 High  R 108 High  R 101 High  R 117 High    Component Ref Range & Units (hover) 1 yr ago (10/08/23) 2 yr ago (09/16/22) 3 yr ago (09/03/21) 4 yr ago (08/28/20) 6 yr ago (07/27/18) 7 yr ago (07/21/17) 7 yr ago (12/18/16)  Hemoglobin A1C 5.4 5.7 Abnormal  5.8 High  R, CM 5.5 R, CM 5.5 R, CM 5.5 R, CM 5.4 R    Hyperlipidemia:  TG was noted to be >200 in 08/2021 and 08/2022.  Normal last year, when he had been out of fish oil for about a month, had been taking just 1 daily prior to that. Today he reports taking 1 fish oil daily.   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 Infrequent flares, related to travel, sun, stress (and possibly connected to eating shrimp). Last refilled in 01/2024.  He would like a refill today.   He has taken some when he felt it coming on, hasn't had any true outbreaks.  Allergies: He hasn't been taking the singulair  as regularly in the last year.  He is taking claritin daily (changes to zyrtec every 6 months or so).  He uses benadryl  prn flares, hasn't needed in a while.. (He previously used Flonase , stopped due to getting bumps in his nose, and no longer needs it.)    Anxiety/insomnia:  He uses 1/2 tablet of alprazolam  intermittently to help with sleep, usually related to travel.  Last filled #30 in 06/2024, with prior fills in July and February of 2025.   He currently has about 10 left.  He is requesting a refill, since he has a bunch of trips planned.    Immunization History   Administered Date(s) Administered   Fluad Quad(high Dose 65+) 07/13/2024   Fluad Trivalent(High Dose 65+) 07/17/2023   INFLUENZA, HIGH DOSE SEASONAL PF 07/01/2018, 07/29/2022   Influenza Split 06/11/2012   Influenza,inj,Quad PF,6+ Mos 06/02/2013, 06/02/2014, 06/07/2015, 06/05/2016, 06/30/2017, 07/15/2019, 07/11/2020, 07/10/2021   Moderna Sars-Covid-2 Vaccination 12/02/2019, 05/08/2020   PNEUMOCOCCAL CONJUGATE-20 09/16/2022   Tdap 08/30/2010, 08/28/2020   Zoster Recombinant(Shingrix ) 08/08/2017, 11/07/2017    Last colonoscopy: 10/2017, had tubular adenoma, diverticulosis. He was told to f/u in 7 years. He has a consult scheduled for 11/01/24. Last PSA: Lab Results  Component Value Date   PSA1 1.0 10/08/2023   PSA1 0.8 09/16/2022   PSA1 1.0 09/03/2021   PSA 0.7 07/21/2017   PSA 0.6 06/05/2016   PSA 1.61 06/02/2014   Dentist: twice yearly   Ophtho: yearly   Exercise:  Recent cardio has been 3x/week 15-30 minutes--jump rope, walk in the neighborhood.  Did a mile on the treadmill this morning (prefers to be outside). Weights and bands about 3x/week for the last 3 months (used to be more often, 5-7d/week) Plays golf, walks the courses when he can.   Other doctors caring for patient: Derm--Dr. Joshua Dentist--Dr. Suellen Ophtho--Dr.McCuen GI--Dr. Rollin  He has a living will and healthcare power of attorney (not in chart)     12/13/2019    9:05 AM 09/03/2021    9:45 AM 09/16/2022    8:47 AM 10/08/2023    1:51 PM 10/28/2024    8:32 AM  Fall Risk  Falls in the past year?  1 1 0 0  Number of falls in past year - Comments   09/07/22 while hunting fell in a brush pile    Was there an injury with Fall?  0  0  0  0  Was there an injury with Fall? - Comments  scrapped up knee  skinned right knee     Fall Risk Category Calculator  2 1 0 0  Fall Risk Category (Retired)  Moderate  Low     (RETIRED) Patient Fall Risk Level Low fall risk  Low fall risk  Low fall risk     Patient at Risk for  Falls Due to  History of fall(s) History of fall(s) No Fall Risks No Fall Risks  Fall risk Follow up  Falls evaluation completed  Falls evaluation completed  Falls evaluation completed Falls evaluation completed     Data saved with  a previous flowsheet row definition      10/28/2024    8:32 AM 10/08/2023    1:52 PM 09/16/2022    8:47 AM 09/03/2021    9:46 AM 08/28/2020    8:47 AM  Depression screen PHQ 2/9  Decreased Interest 0 0 0 0 0  Down, Depressed, Hopeless 0 0 0 0 0  PHQ - 2 Score 0 0 0 0 0     PMH, PSH, SH and FH were reviewed and updated.   Outpatient Encounter Medications as of 10/28/2024  Medication Sig Note   hydrocortisone 2.5 % ointment APPLY TO AFFECTED AREA TWICE A DAY 10/28/2024: As needed   ibuprofen (ADVIL) 200 MG tablet Take 200 mg by mouth every 6 (six) hours as needed. 10/08/2023: As needed   lisinopril -hydrochlorothiazide  (ZESTORETIC ) 20-12.5 MG tablet TAKE 1 TABLET BY MOUTH EVERY DAY    loratadine (CLARITIN) 10 MG tablet Take 10 mg by mouth daily.    montelukast  (SINGULAIR ) 10 MG tablet TAKE 1 TABLET BY MOUTH EVERYDAY AT BEDTIME 10/28/2024: Hasn't been using every day   Multiple Vitamins-Minerals (MULTIVITAMIN WITH MINERALS) tablet Take 1 tablet by mouth daily.    Omega 3 1000 MG CAPS Take 1 capsule by mouth daily. 10/08/2023: Stopped when he ran out about a month ago   Zinc 50 MG TABS Take 1 tablet by mouth daily.    [DISCONTINUED] ALPRAZolam  (XANAX ) 1 MG tablet TAKE 0.5-1 TABLETS (0.5-1 MG TOTAL) BY MOUTH AT BEDTIME AS NEEDED FOR SLEEP.    [DISCONTINUED] valACYclovir  (VALTREX ) 1000 MG tablet TAKE 2 TABLETS AT ONSET OF COLD SORE. REPEAT ONCE IN 12 HOURS (TOTAL OF 4 TABLETS PER COURSE) 10/28/2024: As needed   ALPRAZolam  (XANAX ) 1 MG tablet Take 0.5-1 tablets (0.5-1 mg total) by mouth at bedtime as needed for sleep.    valACYclovir  (VALTREX ) 1000 MG tablet TAKE 2 TABLETS AT ONSET OF COLD SORE. REPEAT ONCE IN 12 HOURS (TOTAL OF 4 TABLETS PER COURSE)    No  facility-administered encounter medications on file as of 10/28/2024.   No Known Allergies   ROS: The patient denies anorexia, fever, headaches, vision loss, decreased hearing, ear pain, hoarseness, chest pain, palpitations, dizziness, syncope, dyspnea on exertion, cough, swelling, nausea, vomiting, diarrhea, constipation, abdominal pain, melena, hematochezia, indigestion/heartburn, hematuria, incontinence, erectile dysfunction, nocturia, weakened urine stream, dysuria, genital lesions, joint pains, numbness, tingling, weakness, tremor, suspicious skin lesions, depression, abnormal bleeding/bruising, or enlarged lymph nodes.   Sees Dr. Joshua regularly (derm). Allergies are controlled     PHYSICAL EXAM:  BP 138/88   Pulse 79   Ht 5' 9 (1.753 m)   Wt 212 lb 3.2 oz (96.3 kg)   BMI 31.34 kg/m   Wt Readings from Last 3 Encounters:  10/28/24 212 lb 3.2 oz (96.3 kg)  10/08/23 208 lb (94.3 kg)  01/02/23 211 lb (95.7 kg)  08/2022 212 lb 9.6 oz 08/2021 203 lb 12.8 oz  07/2020 198# 3.2 oz 08/2019 192# 12.8 oz   General Appearance:   Alert, cooperative, no distress, appears stated age    Head:   Normocephalic, without obvious abnormality, atraumatic    Eyes:   PERRL, conjunctiva/corneas clear, EOM's intact, fundi benign    Ears:   Normal TM and external ear canal on the L, right was partially obscured by cerumen   Nose:   No drainage or sinus tenderness  Throat:   Normal mucosa  Neck:   Supple, no lymphadenopathy; thyroid: no enlargement/tenderness/nodules; no carotid bruit or JVD  Back:   Spine nontender, no curvature, ROM normal, no CVA tenderness    Lungs:   Clear to auscultation bilaterally without wheezes, rales or ronchi; respirations unlabored    Chest Wall:   No tenderness or deformity    Heart:   Regular rate and rhythm, S1 and S2 normal, no murmur, rub or gallop    Breast Exam:  No chest wall tenderness, masses or gynecomastia   Abdomen:   Soft, non-tender, obese,  nondistended, normoactive bowel sounds, no masses, no hepatosplenomegaly.  Genitalia:   Normal male external genitalia without lesions. Testicles without masses. No inguinal hernias.    Rectal:   Normal sphincter tone, no masses. Prostate smooth, not enlarged, no nodules. Heme negative stool  Extremities:   No clubbing, cyanosis or edema    Pulses:   2+ and symmetric all extremities    Skin:   Skin color, texture, turgor normal, no rashes or lesions. No suspicious lesions  Lymph nodes:   Cervical, supraclavicular, and inguinal nodes normal    Neurologic:   Normal strength, sensation and gait; reflexes 2+ and symmetric throughout               Psych:  Normal mood, affect, hygiene and grooming, speech and eye contact   Lab Results  Component Value Date   HGBA1C 5.5 10/28/2024    ASSESSMENT/PLAN:  Annual physical exam - Plan: Lipid Panel, CMP14+EGFR, PSA, HgB A1c, CBC with Differential/Platelet, Microalbumin / creatinine urine ratio  Allergic rhinitis due to pollen, unspecified seasonality - Discussed singulair , rec for daily usage when needed (can be seasonal vs year-long). Cont antihistamines prn (daily if needed)  Essential hypertension, benign - BP higher than usual in office, and high when donating blood.  Reviewed goals, low sodium diet, exercise, wt loss. f/u if remains high - Plan: CMP14+EGFR, Microalbumin / creatinine urine ratio  Hypertriglyceridemia - reviewed proper diet, cont fish oil - Plan: Lipid Panel  Impaired fasting glucose - Reviewed proper diet, encouraged daily exercise and weight loss - Plan: HgB A1c  Screening for prostate cancer - Plan: PSA  Herpes labialis - infrequent flare. Continue Valtrex  prn - Plan: valACYclovir  (VALTREX ) 1000 MG tablet  Anxiety state - insomnia related to travel.  Encouraged limited use of alprazolam .  Refill provided - Plan: ALPRAZolam  (XANAX ) 1 MG tablet  Decreased libido - will check testosterone. In discussing, he recognized that work  stress may be a factor - Plan: Testosterone  Class 1 obesity due to excess calories with serious comorbidity and body mass index (BMI) of 31.0 to 31.9 in adult - comorbids HTN, high TG, IFG. Counseled in detail re: diet, portions, exercise, wt loss, briefly mentioned meds (not interested)  Discussed PSA screening (risks/benefits), recommended at least 30 minutes of aerobic activity at least 5 days/week, weight-bearing exercise at least 2x/wk; proper sunscreen use reviewed; healthy diet and alcohol recommendations (less than or equal to 2 drinks/day) reviewed; regular seatbelt use; changing batteries in smoke detectors. Immunization recommendations discussed-- continue yearly flu shots.  Declined COVID booster. Discussed RSV, can wait until age 75 Colonoscopy recommendations reviewed, due now (7 yr f/u 10/2024, scheduled)  F/u 1 year for CPE, sooner prn, based on lab results.   "

## 2024-10-28 ENCOUNTER — Ambulatory Visit: Payer: BC Managed Care – PPO | Admitting: Family Medicine

## 2024-10-28 ENCOUNTER — Encounter: Payer: Self-pay | Admitting: Family Medicine

## 2024-10-28 VITALS — BP 138/88 | HR 79 | Ht 69.0 in | Wt 212.2 lb

## 2024-10-28 DIAGNOSIS — J301 Allergic rhinitis due to pollen: Secondary | ICD-10-CM | POA: Diagnosis not present

## 2024-10-28 DIAGNOSIS — B001 Herpesviral vesicular dermatitis: Secondary | ICD-10-CM

## 2024-10-28 DIAGNOSIS — I10 Essential (primary) hypertension: Secondary | ICD-10-CM | POA: Diagnosis not present

## 2024-10-28 DIAGNOSIS — R7301 Impaired fasting glucose: Secondary | ICD-10-CM

## 2024-10-28 DIAGNOSIS — R6882 Decreased libido: Secondary | ICD-10-CM | POA: Diagnosis not present

## 2024-10-28 DIAGNOSIS — E66811 Obesity, class 1: Secondary | ICD-10-CM

## 2024-10-28 DIAGNOSIS — Z6831 Body mass index (BMI) 31.0-31.9, adult: Secondary | ICD-10-CM | POA: Diagnosis not present

## 2024-10-28 DIAGNOSIS — F411 Generalized anxiety disorder: Secondary | ICD-10-CM

## 2024-10-28 DIAGNOSIS — Z0000 Encounter for general adult medical examination without abnormal findings: Secondary | ICD-10-CM | POA: Diagnosis not present

## 2024-10-28 DIAGNOSIS — E6609 Other obesity due to excess calories: Secondary | ICD-10-CM | POA: Diagnosis not present

## 2024-10-28 DIAGNOSIS — Z125 Encounter for screening for malignant neoplasm of prostate: Secondary | ICD-10-CM

## 2024-10-28 DIAGNOSIS — E781 Pure hyperglyceridemia: Secondary | ICD-10-CM

## 2024-10-28 LAB — POCT GLYCOSYLATED HEMOGLOBIN (HGB A1C): Hemoglobin A1C: 5.5 % (ref 4.0–5.6)

## 2024-10-28 MED ORDER — ALPRAZOLAM 1 MG PO TABS: 0.5000 mg | ORAL_TABLET | Freq: Every evening | ORAL | 0 refills | Status: AC | PRN

## 2024-10-28 MED ORDER — VALACYCLOVIR HCL 1 G PO TABS: ORAL_TABLET | ORAL | 0 refills | Status: AC

## 2024-10-29 ENCOUNTER — Ambulatory Visit: Payer: Self-pay | Admitting: Family Medicine

## 2024-10-29 LAB — LIPID PANEL
Chol/HDL Ratio: 3.4 ratio (ref 0.0–5.0)
Cholesterol, Total: 165 mg/dL (ref 100–199)
HDL: 48 mg/dL
LDL Chol Calc (NIH): 90 mg/dL (ref 0–99)
Triglycerides: 154 mg/dL — ABNORMAL HIGH (ref 0–149)
VLDL Cholesterol Cal: 27 mg/dL (ref 5–40)

## 2024-10-29 LAB — CBC WITH DIFFERENTIAL/PLATELET
Basophils Absolute: 0.1 10*3/uL (ref 0.0–0.2)
Basos: 1 %
EOS (ABSOLUTE): 0.1 10*3/uL (ref 0.0–0.4)
Eos: 2 %
Hematocrit: 49.2 % (ref 37.5–51.0)
Hemoglobin: 16.1 g/dL (ref 13.0–17.7)
Immature Grans (Abs): 0 10*3/uL (ref 0.0–0.1)
Immature Granulocytes: 0 %
Lymphocytes Absolute: 2.2 10*3/uL (ref 0.7–3.1)
Lymphs: 31 %
MCH: 29.8 pg (ref 26.6–33.0)
MCHC: 32.7 g/dL (ref 31.5–35.7)
MCV: 91 fL (ref 79–97)
Monocytes Absolute: 0.7 10*3/uL (ref 0.1–0.9)
Monocytes: 9 %
Neutrophils Absolute: 4 10*3/uL (ref 1.4–7.0)
Neutrophils: 57 %
Platelets: 270 10*3/uL (ref 150–450)
RBC: 5.4 x10E6/uL (ref 4.14–5.80)
RDW: 12.1 % (ref 11.6–15.4)
WBC: 7 10*3/uL (ref 3.4–10.8)

## 2024-10-29 LAB — CMP14+EGFR
ALT: 41 [IU]/L (ref 0–44)
AST: 30 [IU]/L (ref 0–40)
Albumin: 4.7 g/dL (ref 3.9–4.9)
Alkaline Phosphatase: 76 [IU]/L (ref 47–123)
BUN/Creatinine Ratio: 16 (ref 10–24)
BUN: 15 mg/dL (ref 8–27)
Bilirubin Total: 0.7 mg/dL (ref 0.0–1.2)
CO2: 22 mmol/L (ref 20–29)
Calcium: 9.6 mg/dL (ref 8.6–10.2)
Chloride: 99 mmol/L (ref 96–106)
Creatinine, Ser: 0.92 mg/dL (ref 0.76–1.27)
Globulin, Total: 3 g/dL (ref 1.5–4.5)
Glucose: 108 mg/dL — ABNORMAL HIGH (ref 70–99)
Potassium: 4.3 mmol/L (ref 3.5–5.2)
Sodium: 136 mmol/L (ref 134–144)
Total Protein: 7.7 g/dL (ref 6.0–8.5)
eGFR: 91 mL/min/{1.73_m2}

## 2024-10-29 LAB — MICROALBUMIN / CREATININE URINE RATIO
Creatinine, Urine: 75.9 mg/dL
Microalb/Creat Ratio: <4 mg/g{creat} (ref 0–29)
Microalbumin, Urine: <3 ug/mL

## 2024-10-29 LAB — TESTOSTERONE: Testosterone: 547 ng/dL (ref 264–916)

## 2024-10-29 LAB — PSA: Prostate Specific Ag, Serum: 1.4 ng/mL (ref 0.0–4.0)

## 2024-11-03 ENCOUNTER — Other Ambulatory Visit: Payer: Self-pay | Admitting: Family Medicine

## 2024-11-03 DIAGNOSIS — J301 Allergic rhinitis due to pollen: Secondary | ICD-10-CM

## 2025-11-10 ENCOUNTER — Encounter: Admitting: Family Medicine
